# Patient Record
Sex: Female | Born: 1980 | Race: White | Hispanic: No | Marital: Single | Smoking: Never smoker
Health system: Southern US, Community
[De-identification: ages and names within clinical notes are randomized; demographics above are authoritative.]

## PROBLEM LIST (undated history)

## (undated) DIAGNOSIS — M1990 Unspecified osteoarthritis, unspecified site: Secondary | ICD-10-CM

## (undated) DIAGNOSIS — F988 Other specified behavioral and emotional disorders with onset usually occurring in childhood and adolescence: Secondary | ICD-10-CM

## (undated) DIAGNOSIS — F419 Anxiety disorder, unspecified: Secondary | ICD-10-CM

## (undated) DIAGNOSIS — R12 Heartburn: Secondary | ICD-10-CM

## (undated) DIAGNOSIS — J45909 Unspecified asthma, uncomplicated: Secondary | ICD-10-CM

## (undated) DIAGNOSIS — F32A Depression, unspecified: Secondary | ICD-10-CM

## (undated) HISTORY — DX: Heartburn: R12

## (undated) HISTORY — PX: OTHER SURGICAL HISTORY: SHX169

## (undated) HISTORY — PX: SPLENECTOMY, TOTAL: SHX788

## (undated) HISTORY — DX: Other specified behavioral and emotional disorders with onset usually occurring in childhood and adolescence: F98.8

## (undated) HISTORY — DX: Unspecified osteoarthritis, unspecified site: M19.90

## (undated) HISTORY — DX: Depression, unspecified: F32.A

## (undated) HISTORY — DX: Unspecified asthma, uncomplicated: J45.909

## (undated) HISTORY — DX: Anxiety disorder, unspecified: F41.9

---
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
## 2005-01-08 HISTORY — PX: PANCREAS SURGERY: SHX731

## 2005-01-08 HISTORY — PX: SPLENECTOMY, TOTAL: SHX788

## 2014-04-29 HISTORY — PX: TONSILLECTOMY: SUR1361

## 2019-08-13 ENCOUNTER — Ambulatory Visit: Payer: Managed Care, Other (non HMO)

## 2019-08-13 ENCOUNTER — Ambulatory Visit (INDEPENDENT_AMBULATORY_CARE_PROVIDER_SITE_OTHER): Payer: Managed Care, Other (non HMO)

## 2019-08-13 ENCOUNTER — Ambulatory Visit
Admission: EM | Admit: 2019-08-13 | Discharge: 2019-08-13 | Disposition: A | Discharge status: Home / Self Care | Payer: Managed Care, Other (non HMO)

## 2019-08-13 ENCOUNTER — Other Ambulatory Visit: Payer: Self-pay

## 2019-08-13 DIAGNOSIS — M25572 Pain in left ankle and joints of left foot: Secondary | ICD-10-CM

## 2019-08-13 DIAGNOSIS — S92352A Displaced fracture of fifth metatarsal bone, left foot, initial encounter for closed fracture: Secondary | ICD-10-CM | POA: Diagnosis not present

## 2019-08-13 DIAGNOSIS — W19XXXA Unspecified fall, initial encounter: Secondary | ICD-10-CM

## 2019-08-13 DIAGNOSIS — M79672 Pain in left foot: Secondary | ICD-10-CM

## 2019-08-13 NOTE — Discharge Instructions (Addendum)

## 2019-08-13 NOTE — ED Triage Notes (Signed)
Patient presents with left ankle injury/pain that occurred last night while "playing around at home."  She has been taking Tylenol with some relief and ice pack applied after injury occurred.

## 2019-08-13 NOTE — ED Provider Notes (Signed)
[facility]    CSN: [id number] Arrival date & time: 08/13/19  1019      History   Chief Complaint Chief Complaint  Patient presents with   Ankle Injury    left    HPI Leslie Holt is a 39 y.o. female with history of splenectomy presenting for left ankle pain/swelling after injury that occurred last night.  Patient states that she was demonstrating a flying kick when she landed on her left ankle and suffered subsequent inversion injury.  Patient has been using Tylenol, ice with moderate relief of pain since injury.  States she has difficulty moving her fifth toe: Denies numbness.  Unable to bear weight.   History reviewed. No pertinent past medical history.  There are no problems to display for this patient.   Past Surgical History:  Procedure Laterality Date   pancreas     SPLENECTOMY, TOTAL      OB History   No obstetric history on file.      Home Medications    Prior to Admission medications   Medication Sig Start Date End Date Taking? Authorizing Provider  buPROPion (WELLBUTRIN XL) 300 MG 24 hr tablet Take 300 mg by mouth daily.   Yes [provider]  escitalopram (LEXAPRO) 10 MG tablet Take 10 mg by mouth daily.   Yes [provider]  Norgestimate-Ethinyl Estradiol Triphasic (ORTHO TRI-CYCLEN LO) 0.18/0.215/0.25 MG-25 MCG tab Take 1 tablet by mouth daily.   Yes [provider]    Family History History reviewed. No pertinent family history.  Social History Social History   Tobacco Use   Smoking status: Never Smoker   Smokeless tobacco: Never Used  Substance Use Topics   Alcohol use: Yes    Comment: occassional   Drug use: Never     Allergies   Latex, Shellfish allergy, and Sulfa antibiotics   Review of Systems As per HPI   Physical Exam Triage Vital Signs ED Triage Vitals  Enc Vitals Group     BP 08/13/19 1036 111/60     Pulse Rate 08/13/19 1042 (!) 105     Resp 08/13/19 1034 15     Temp  08/13/19 1034 98.8 F (37.1 C)     Temp Source 08/13/19 1034 Oral     SpO2 08/13/19 1034 96 %     Weight --      Height --      Head Circumference --      Peak Flow --      Pain Score 08/13/19 1038 5     Pain Loc --      Pain Edu? --      Excl. in [place]? --    No data found.  Updated Vital Signs BP 111/60 (BP Location: Right Arm)    Pulse (!) 105    Temp 98.8 F (37.1 C) (Oral)    Resp 15    LMP 08/05/2019 (Exact Date)    SpO2 95%   Visual Acuity Right Eye Distance:   Left Eye Distance:   Bilateral Distance:    Right Eye Near:   Left Eye Near:    Bilateral Near:     Physical Exam Constitutional:      General: She is not in acute distress. HENT:     Head: Normocephalic and atraumatic.     Mouth/Throat:     Mouth: Mucous membranes are moist.     Pharynx: Oropharynx is clear.  Eyes:     General: No scleral icterus.  Pupils: Pupils are equal, round, and reactive to light.  Neck:     Comments: Trachea midline, negative JVD Cardiovascular:     Rate and Rhythm: Tachycardia present.  Pulmonary:     Effort: Pulmonary effort is normal.  Musculoskeletal:     Comments: Patient has significant swelling over dorsal lateral aspect of foot with blue discoloration as well as significant swelling over the lateral malleoli with significant TTP.  Patient has decreased ROM of ankle, toes.  DP pulses 2+ bilaterally, skin is warm, and patient endorsing intact sensation in feet bilaterally.  Skin:    Capillary Refill: Capillary refill takes less than 2 seconds.     Coloration: Skin is not jaundiced or pale.     Findings: Bruising present.     Comments: No open wound  Neurological:     General: No focal deficit present.     Mental Status: She is alert and oriented to person, place, and time.      UC Treatments / Results  Labs (all labs ordered are listed, but only abnormal results are displayed) Labs Reviewed - No data to display  EKG   Radiology DG Ankle Complete  Left  Result Date: 08/13/2019 CLINICAL DATA:  Status post injury with swelling and pain. EXAM: LEFT ANKLE COMPLETE - 3+ VIEW COMPARISON:  None. FINDINGS: There is fracture of the fifth metatarsal shaft. Soft tissue swelling over the lateral malleolus is noted. No other acute fracture or dislocation is noted. IMPRESSION: Fracture of fifth metatarsal shaft. Soft tissue swelling over lateral malleolus. Electronically Signed   By: Sherian Rein M.D.   On: 08/13/2019 11:21   DG Foot Complete Left  Result Date: 08/13/2019 CLINICAL DATA:  Left foot pain for 1 day EXAM: LEFT FOOT - COMPLETE 3+ VIEW COMPARISON:  None. FINDINGS: There is displaced fracture of the fifth metatarsal shaft. There is no dislocation. IMPRESSION: Fracture of fifth metatarsal shaft. Electronically Signed   By: Sherian Rein M.D.   On: 08/13/2019 11:20    Procedures Procedures (including critical care time)  Medications Ordered in UC Medications - No data to display  Initial Impression / Assessment and Plan / UC Course  I have reviewed the triage vital signs and the nursing notes.  Pertinent labs & imaging results that were available during my care of the patient were reviewed by me and considered in my medical decision making (see chart for details).     Patient afebrile, nontoxic in office today.  Mild tachycardia likely secondary to pain : Denies active chest pain or difficulty breathing.  X-ray of left ankle and foot obtained in office, reviewed by me radiology: Significant for soft tissue swelling over lateral malleolus and noted to have displaced fracture of fifth metatarsal shaft without dislocation.  Low concern for compartment syndrome at this time, though discussed return precautions thereof.  Discussed case with Dr. Everardo Pacific of orthopedics who agrees with A/P: Patient given crutches, ice, cam walker boot with instructions to be nonweightbearing and follow-up with him in office at 8:30 tomorrow morning.  Relayed  appointment date/time with patient who verbalized understanding, agreeable to keeping this.  Tolerated placement of cam walker boot well and ambulated with crutches to my satisfaction.  Return precautions discussed, patient verbalized understanding and is agreeable to plan.  Greater than 45 minutes were spent providing and coordinating care and reviewing diagnostics done in office. Final Clinical Impressions(s) / UC Diagnoses   Final diagnoses:  Closed displaced fracture of fifth metatarsal bone of left foot,  initial encounter     Discharge Instructions     Recommend RICE: rest, ice, compression, elevation as needed for pain.    Heat therapy (hot compress, warm wash red, hot showers, etc.) can help relax muscles and soothe muscle aches. Cold therapy (ice packs) can be used to help swelling both after injury and after prolonged use of areas of chronic pain/aches.  For pain: recommend 350 mg-1000 mg of Tylenol (acetaminophen) and/or 200 mg - 800 mg of Advil (ibuprofen, Motrin) every 8 hours as needed.  May alternate between the two throughout the day as they are generally safe to take together.  DO NOT exceed more than 3000 mg of Tylenol or 3200 mg of ibuprofen in a 24 hour period as this could damage your stomach, kidneys, liver, or increase your bleeding risk.     ED Prescriptions    None     I have reviewed the PDMP during this encounter.   Hall[place]Potvin, [place], [place] 08/13/19 1227

## 2020-04-08 ENCOUNTER — Ambulatory Visit
Admission: RE | Admit: 2020-04-08 | Discharge: 2020-04-08 | Disposition: A | Discharge status: Home / Self Care | Payer: Managed Care, Other (non HMO) | Source: Ambulatory Visit | Attending: Emergency Medicine | Admitting: Emergency Medicine

## 2020-04-08 ENCOUNTER — Other Ambulatory Visit: Payer: Self-pay

## 2020-04-08 VITALS — BP 128/79 | HR 110 | Temp 98.6°F | Resp 18

## 2020-04-08 DIAGNOSIS — J0100 Acute maxillary sinusitis, unspecified: Secondary | ICD-10-CM

## 2020-04-08 DIAGNOSIS — Z1152 Encounter for screening for COVID-19: Secondary | ICD-10-CM

## 2020-04-08 DIAGNOSIS — Z20822 Contact with and (suspected) exposure to covid-19: Secondary | ICD-10-CM | POA: Diagnosis not present

## 2020-04-08 MED ORDER — BENZONATATE 200 MG PO CAPS: 200.0000 mg | ORAL_CAPSULE | Freq: Three times a day (TID) | ORAL | 0 refills | Status: AC | PRN

## 2020-04-08 MED ORDER — OLOPATADINE HCL 0.1 % OP SOLN: 1.0000 [drp] | Freq: Two times a day (BID) | OPHTHALMIC | 0 refills | Status: DC

## 2020-04-08 MED ORDER — FLUTICASONE PROPIONATE 50 MCG/ACT NA SUSP: 1.0000 | Freq: Every day | NASAL | 0 refills | Status: DC

## 2020-04-08 MED ORDER — AMOXICILLIN-POT CLAVULANATE 875-125 MG PO TABS: 1.0000 | ORAL_TABLET | Freq: Two times a day (BID) | ORAL | 0 refills | Status: AC

## 2020-04-08 NOTE — ED Triage Notes (Signed)
Pt c/o sinus pressure to lt side of face with lt eye watery, lt ear pressure, sore throat, and cough since Saturday evening.

## 2020-04-08 NOTE — ED Provider Notes (Signed)
[facility]    CSN: [id number] Arrival date & time: 04/08/20  1455      History   Chief Complaint Chief Complaint  Patient presents with   appt 3- sinus pressure    HPI Leslie Holt is a 39 y.o. female presenting today for evaluation of sinus congestion pressure and eye watering.  Patient reports symptoms began on Thursday, approximately 5 to 6 days ago.  She denies any associated fevers.  She takes Claritin daily, but has not used other over-the-counter medicines for symptoms.  Does report sore throat and cough, but attributes this to drainage.  Denies close sick contacts.   HPI  History reviewed. No pertinent past medical history.  There are no problems to display for this patient.   Past Surgical History:  Procedure Laterality Date   pancreas     SPLENECTOMY, TOTAL      OB History   No obstetric history on file.      Home Medications    Prior to Admission medications   Medication Sig Start Date End Date Taking? Authorizing Provider  amoxicillin-clavulanate (AUGMENTIN) 875-125 MG tablet Take 1 tablet by mouth every 12 (twelve) hours for 7 days. 04/08/20 04/15/20  Jedaiah Rathbun C, PA-C  benzonatate (TESSALON) 200 MG capsule Take 1 capsule (200 mg total) by mouth 3 (three) times daily as needed for up to 7 days for cough. 04/08/20 04/15/20  Cailyn Houdek C, PA-C  buPROPion (WELLBUTRIN XL) 300 MG 24 hr tablet Take 300 mg by mouth daily.    [provider]  escitalopram (LEXAPRO) 10 MG tablet Take 10 mg by mouth daily.    [provider]  fluticasone (FLONASE) 50 MCG/ACT nasal spray Place 1-2 sprays into both nostrils daily. 04/08/20   Elody Kleinsasser C, PA-C  Norgestimate-Ethinyl Estradiol Triphasic (ORTHO TRI-CYCLEN LO) 0.18/0.215/0.25 MG-25 MCG tab Take 1 tablet by mouth daily.    [provider]  olopatadine (PATANOL) 0.1 % ophthalmic solution Place 1 drop into both eyes 2 (two) times daily. 04/08/20   Tiannah Greenly, Junius Creamer, PA-C    Family History No family history on file.  Social History Social History   Tobacco Use   Smoking status: Never Smoker   Smokeless tobacco: Never Used  Substance Use Topics   Alcohol use: Yes    Comment: occassional   Drug use: Never     Allergies   Latex, Shellfish allergy, and Sulfa antibiotics   Review of Systems Review of Systems  Constitutional: Positive for fatigue. Negative for activity change, appetite change, chills and fever.  HENT: Positive for congestion, rhinorrhea, sinus pressure and sore throat. Negative for ear pain and trouble swallowing.   Eyes: Negative for discharge and redness.  Respiratory: Positive for cough. Negative for chest tightness and shortness of breath.   Cardiovascular: Negative for chest pain.  Gastrointestinal: Negative for abdominal pain, diarrhea, nausea and vomiting.  Musculoskeletal: Negative for myalgias.  Skin: Negative for rash.  Neurological: Negative for dizziness, light-headedness and headaches.     Physical Exam Triage Vital Signs ED Triage Vitals  Enc Vitals Group     BP 04/08/20 1528 128/79     Pulse Rate 04/08/20 1528 (!) 110     Resp 04/08/20 1528 18     Temp 04/08/20 1528 98.6 F (37 C)     Temp Source 04/08/20 1528 Oral     SpO2 04/08/20 1528 94 %     Weight --      Height --  Head Circumference --      Peak Flow --      Pain Score 04/08/20 1529 2     Pain Loc --      Pain Edu? --      Excl. in [place]? --    No data found.  Updated Vital Signs BP 128/79 (BP Location: Left Arm)    Pulse (!) 110    Temp 98.6 F (37 C) (Oral)    Resp 18    LMP 03/25/2020    SpO2 94%   Visual Acuity Right Eye Distance:   Left Eye Distance:   Bilateral Distance:    Right Eye Near:   Left Eye Near:    Bilateral Near:     Physical Exam Vitals and nursing note reviewed.  Constitutional:      Appearance: She is well-developed.     Comments: No acute distress  HENT:     Head: Normocephalic and  atraumatic.     Ears:     Comments: Bilateral ears without tenderness to palpation of external auricle, tragus and mastoid, EAC's without erythema or swelling, TM's with good bony landmarks and cone of light. Non erythematous.     Nose: Nose normal.     Mouth/Throat:     Comments: Oral mucosa pink and moist, no tonsillar enlargement or exudate. Posterior pharynx patent and nonerythematous, no uvula deviation or swelling. Normal phonation. Eyes:     Extraocular Movements: Extraocular movements intact.     Conjunctiva/sclera: Conjunctivae normal.     Pupils: Pupils are equal, round, and reactive to light.     Comments: Left eye with clear watery drainage, no erythema  Cardiovascular:     Rate and Rhythm: Normal rate.  Pulmonary:     Effort: Pulmonary effort is normal. No respiratory distress.     Comments: Breathing comfortably at rest, CTABL, no wheezing, rales or other adventitious sounds auscultated Abdominal:     General: There is no distension.  Musculoskeletal:        General: Normal range of motion.     Cervical back: Neck supple.  Skin:    General: Skin is warm and dry.  Neurological:     Mental Status: She is alert and oriented to person, place, and time.      UC Treatments / Results  Labs (all labs ordered are listed, but only abnormal results are displayed) Labs Reviewed  NOVEL CORONAVIRUS, NAA    EKG   Radiology No results found.  Procedures Procedures (including critical care time)  Medications Ordered in UC Medications - No data to display  Initial Impression / Assessment and Plan / UC Course  I have reviewed the triage vital signs and the nursing notes.  Pertinent labs & imaging results that were available during my care of the patient were reviewed by me and considered in my medical decision making (see chart for details).     Covid test pending, treating for sinusitis given unilateral nature with associated pressure, Augmentin x7 days, continue  symptomatic and supportive care as well Flonase to add to Claritin, Tessalon for cough.  Rest and fluids.  Discussed strict return precautions. Patient verbalized understanding and is agreeable with plan.  Final Clinical Impressions(s) / UC Diagnoses   Final diagnoses:  Encounter for screening laboratory testing for COVID-19 virus  Acute non-recurrent maxillary sinusitis     Discharge Instructions     Covid test pending, monitor my chart for results Begin Augmentin twice daily for 1 week Continue Claritin,  add in Flonase nasal spray 1 to 2 spray in each nostril daily Tessalon/benzonatate every 8 hours for cough Olopatadine eyedrops in left eye as needed for tearing twice daily Rest and fluids Follow-up if not improving or worsening    ED Prescriptions    Medication Sig Dispense Auth. Provider   olopatadine (PATANOL) 0.1 % ophthalmic solution Place 1 drop into both eyes 2 (two) times daily. 5 mL Jobin Montelongo C, PA-C   amoxicillin-clavulanate (AUGMENTIN) 875-125 MG tablet Take 1 tablet by mouth every 12 (twelve) hours for 7 days. 14 tablet Forestine Macho C, PA-C   fluticasone (FLONASE) 50 MCG/ACT nasal spray Place 1-2 sprays into both nostrils daily. 16 g Amogh Komatsu C, PA-C   benzonatate (TESSALON) 200 MG capsule Take 1 capsule (200 mg total) by mouth 3 (three) times daily as needed for up to 7 days for cough. 28 capsule Kioni Stahl, [place] C, PA-C     PDMP not reviewed this encounter.   Lew Dawes, [place] 04/08/20 1619

## 2020-04-08 NOTE — Discharge Instructions (Signed)
Covid test pending, monitor my chart for results Begin Augmentin twice daily for 1 week Continue Claritin, add in Flonase nasal spray 1 to 2 spray in each nostril daily Tessalon/benzonatate every 8 hours for cough Olopatadine eyedrops in left eye as needed for tearing twice daily Rest and fluids Follow-up if not improving or worsening

## 2020-04-09 LAB — SARS-COV-2, NAA 2 DAY TAT

## 2020-04-09 LAB — NOVEL CORONAVIRUS, NAA: SARS-CoV-2, NAA: NOT DETECTED

## 2020-06-15 ENCOUNTER — Ambulatory Visit: Payer: Managed Care, Other (non HMO) | Attending: Internal Medicine

## 2020-06-15 DIAGNOSIS — Z23 Encounter for immunization: Secondary | ICD-10-CM

## 2020-06-15 NOTE — Progress Notes (Signed)
   Covid-19 Vaccination Clinic  Name:  Leslie Holt    MRN: [id number] DOB: 1980/07/29  06/15/2020  [name] was observed post Covid-19 immunization for 15 minutes without incident. She was provided with Vaccine Information Sheet and instruction to access the V-Safe system.   [name] was instructed to call 911 with any severe reactions post vaccine: Marland Kitchen Difficulty breathing  . Swelling of face and throat  . A fast heartbeat  . A bad rash all over body  . Dizziness and weakness   Immunizations Administered    Name Date Dose VIS Date Route   Pfizer COVID-19 Vaccine 06/15/2020 12:02 PM 0.3 mL 04/17/2020 Intramuscular   Manufacturer: [employer], [employer]   Lot: [id number]   NDC: [address]-7

## 2020-10-08 ENCOUNTER — Ambulatory Visit
Admission: RE | Admit: 2020-10-08 | Discharge: 2020-10-08 | Disposition: A | Discharge status: Home / Self Care | Payer: Managed Care, Other (non HMO) | Source: Ambulatory Visit | Attending: Emergency Medicine | Admitting: Emergency Medicine

## 2020-10-08 ENCOUNTER — Other Ambulatory Visit: Payer: Self-pay

## 2020-10-08 VITALS — BP 115/77 | HR 93 | Temp 98.8°F | Resp 18

## 2020-10-08 DIAGNOSIS — J0100 Acute maxillary sinusitis, unspecified: Secondary | ICD-10-CM

## 2020-10-08 MED ORDER — AMOXICILLIN-POT CLAVULANATE 875-125 MG PO TABS: 1.0000 | ORAL_TABLET | Freq: Two times a day (BID) | ORAL | 0 refills | Status: AC

## 2020-10-08 MED ORDER — PREDNISONE 20 MG PO TABS: 40.0000 mg | ORAL_TABLET | Freq: Every day | ORAL | 0 refills | Status: AC

## 2020-10-08 NOTE — Discharge Instructions (Signed)
Begin Augmentin twice daily for 1 week Prednisone 40 mg daily for 5 days Continue Allegra, Flonase every 2 to 3 days Rest and fluids Follow-up if not improving or worsening

## 2020-10-08 NOTE — ED Triage Notes (Signed)
Pt c/o rt side sinus pressure and drainage since Sunday. States doing nasal rinses with no relief.

## 2020-10-08 NOTE — ED Provider Notes (Signed)
[facility]    CSN: [id number] Arrival date & time: 10/08/20  1148      History   Chief Complaint Chief Complaint  Patient presents with  . Facial Pain  . appt 12    HPI Leslie Holt is a 40 y.o. female presenting today for evaluation of sinus pressure.  Reports right-sided sinus pressure and pain with associated congestion x3 to 4 days, using sinus rinses, Allegra and Flonase without relief.  History of recurrent sinus infections.  Denies any known fevers.  Denies significant cough chest pain or shortness of breath.  HPI  History reviewed. No pertinent past medical history.  There are no problems to display for this patient.   Past Surgical History:  Procedure Laterality Date  . pancreas    . SPLENECTOMY, TOTAL      OB History   No obstetric history on file.      Home Medications    Prior to Admission medications   Medication Sig Start Date End Date Taking? Authorizing Provider  amoxicillin-clavulanate (AUGMENTIN) 875-125 MG tablet Take 1 tablet by mouth every 12 (twelve) hours for 7 days. 10/08/20 10/15/20 Yes Paeton Studer C, PA-C  predniSONE (DELTASONE) 20 MG tablet Take 2 tablets (40 mg total) by mouth daily for 5 days. 10/08/20 10/13/20 Yes Tocarra Gassen C, PA-C  buPROPion (WELLBUTRIN XL) 300 MG 24 hr tablet Take 300 mg by mouth daily.    [provider]  escitalopram (LEXAPRO) 10 MG tablet Take 10 mg by mouth daily.    [provider]  Norgestimate-Ethinyl Estradiol Triphasic (ORTHO TRI-CYCLEN LO) 0.18/0.215/0.25 MG-25 MCG tab Take 1 tablet by mouth daily.    [provider]    Family History History reviewed. No pertinent family history.  Social History Social History   Tobacco Use  . Smoking status: Never Smoker  . Smokeless tobacco: Never Used  Substance Use Topics  . Alcohol use: Yes    Comment: occassional  . Drug use: Never     Allergies   Latex, Shellfish allergy, and Sulfa antibiotics   Review  of Systems Review of Systems  Constitutional: Negative for activity change, appetite change, chills, fatigue and fever.  HENT: Positive for congestion, rhinorrhea, sinus pressure and sore throat. Negative for ear pain and trouble swallowing.   Eyes: Negative for discharge and redness.  Respiratory: Negative for cough, chest tightness and shortness of breath.   Cardiovascular: Negative for chest pain.  Gastrointestinal: Negative for abdominal pain, diarrhea, nausea and vomiting.  Musculoskeletal: Negative for myalgias.  Skin: Negative for rash.  Neurological: Negative for dizziness, light-headedness and headaches.     Physical Exam Triage Vital Signs ED Triage Vitals  Enc Vitals Group     BP      Pulse      Resp      Temp      Temp src      SpO2      Weight      Height      Head Circumference      Peak Flow      Pain Score      Pain Loc      Pain Edu?      Excl. in [place]?    No data found.  Updated Vital Signs BP 115/77 (BP Location: Left Arm)   Pulse 93   Temp 98.8 F (37.1 C) (Oral)   Resp 18   LMP 09/17/2020   SpO2 97%   Visual Acuity Right Eye Distance:  Left Eye Distance:   Bilateral Distance:    Right Eye Near:   Left Eye Near:    Bilateral Near:     Physical Exam Vitals and nursing note reviewed.  Constitutional:      Appearance: She is well-developed.     Comments: No acute distress  HENT:     Head: Normocephalic and atraumatic.     Ears:     Comments: Bilateral ears without tenderness to palpation of external auricle, tragus and mastoid, EAC's without erythema or swelling, TM's with good bony landmarks and cone of light. Non erythematous.     Nose: Nose normal.     Mouth/Throat:     Comments: Oral mucosa pink and moist, no tonsillar enlargement or exudate. Posterior pharynx patent and nonerythematous, no uvula deviation or swelling. Normal phonation. Eyes:     Conjunctiva/sclera: Conjunctivae normal.  Cardiovascular:     Rate and Rhythm:  Normal rate and regular rhythm.  Pulmonary:     Effort: Pulmonary effort is normal. No respiratory distress.     Comments: Breathing comfortably at rest, CTABL, no wheezing, rales or other adventitious sounds auscultated Abdominal:     General: There is no distension.  Musculoskeletal:        General: Normal range of motion.     Cervical back: Neck supple.  Skin:    General: Skin is warm and dry.  Neurological:     Mental Status: She is alert and oriented to person, place, and time.      UC Treatments / Results  Labs (all labs ordered are listed, but only abnormal results are displayed) Labs Reviewed - No data to display  EKG   Radiology No results found.  Procedures Procedures (including critical care time)  Medications Ordered in UC Medications - No data to display  Initial Impression / Assessment and Plan / UC Course  I have reviewed the triage vital signs and the nursing notes.  Pertinent labs & imaging results that were available during my care of the patient were reviewed by me and considered in my medical decision making (see chart for details).     Treating for sinusitis given unilateral symptoms although his symptoms only for 3 to 4 days, providing Augmentin, prednisone course x5 days and continuing over-the-counter allergy/congestion medicines.  Push fluids.  Discussed strict return precautions. Patient verbalized understanding and is agreeable with plan.  Final Clinical Impressions(s) / UC Diagnoses   Final diagnoses:  Acute non-recurrent maxillary sinusitis     Discharge Instructions     Begin Augmentin twice daily for 1 week Prednisone 40 mg daily for 5 days Continue Allegra, Flonase every 2 to 3 days Rest and fluids Follow-up if not improving or worsening    ED Prescriptions    Medication Sig Dispense Auth. Provider   amoxicillin-clavulanate (AUGMENTIN) 875-125 MG tablet Take 1 tablet by mouth every 12 (twelve) hours for 7 days. 14 tablet  Anamarie Hunn C, PA-C   predniSONE (DELTASONE) 20 MG tablet Take 2 tablets (40 mg total) by mouth daily for 5 days. 10 tablet Dollie Bressi, [place] C, PA-C     PDMP not reviewed this encounter.   Lew Dawes, [place] 10/08/20 1347

## 2020-11-16 IMAGING — DX DG FOOT COMPLETE 3+V*L*
3 series · 3 of 3 positions shown · non-contrast
Comparison: None.

CLINICAL DATA: Left foot pain for 1 day

EXAM:
LEFT FOOT - COMPLETE 3+ VIEW

[foot supine dp]
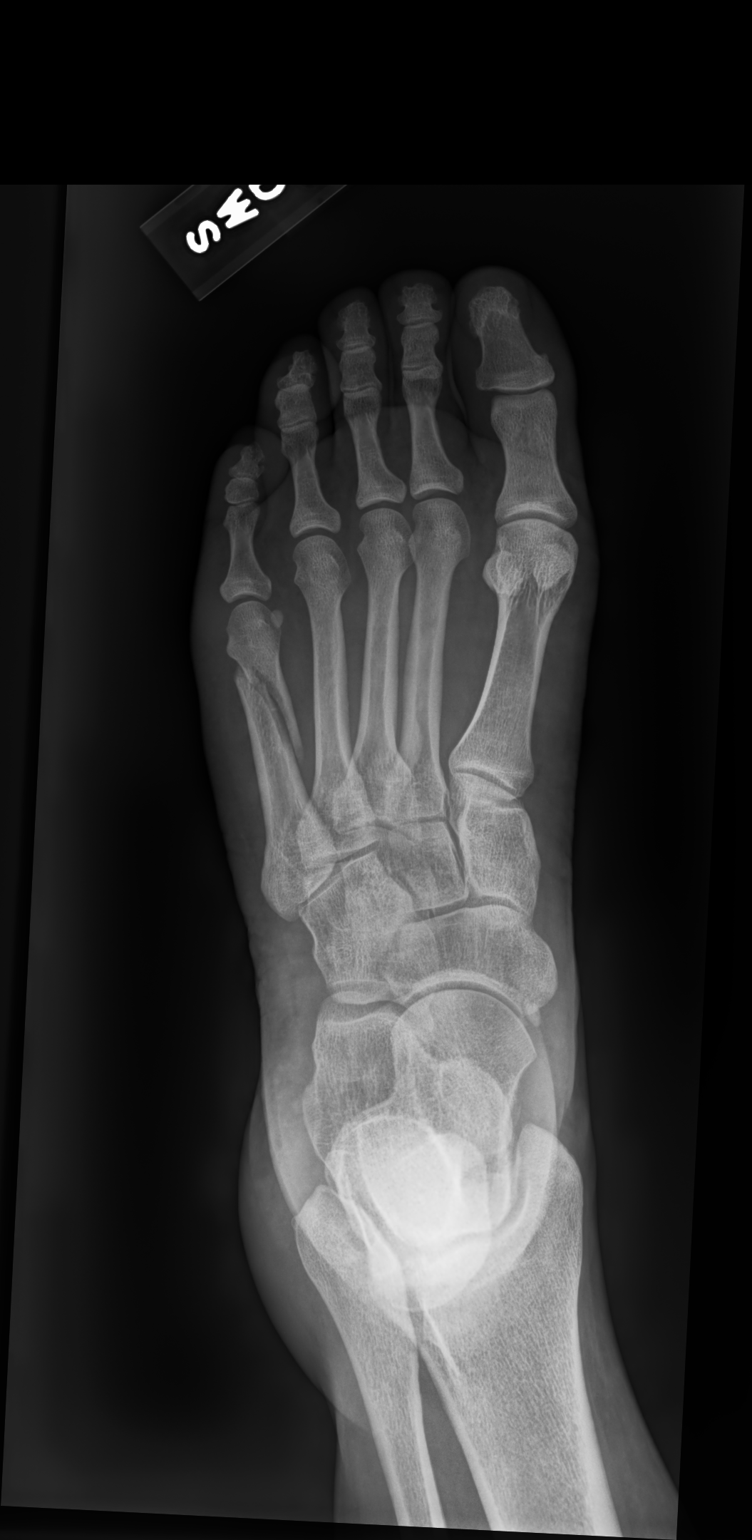

[foot medial oblique]
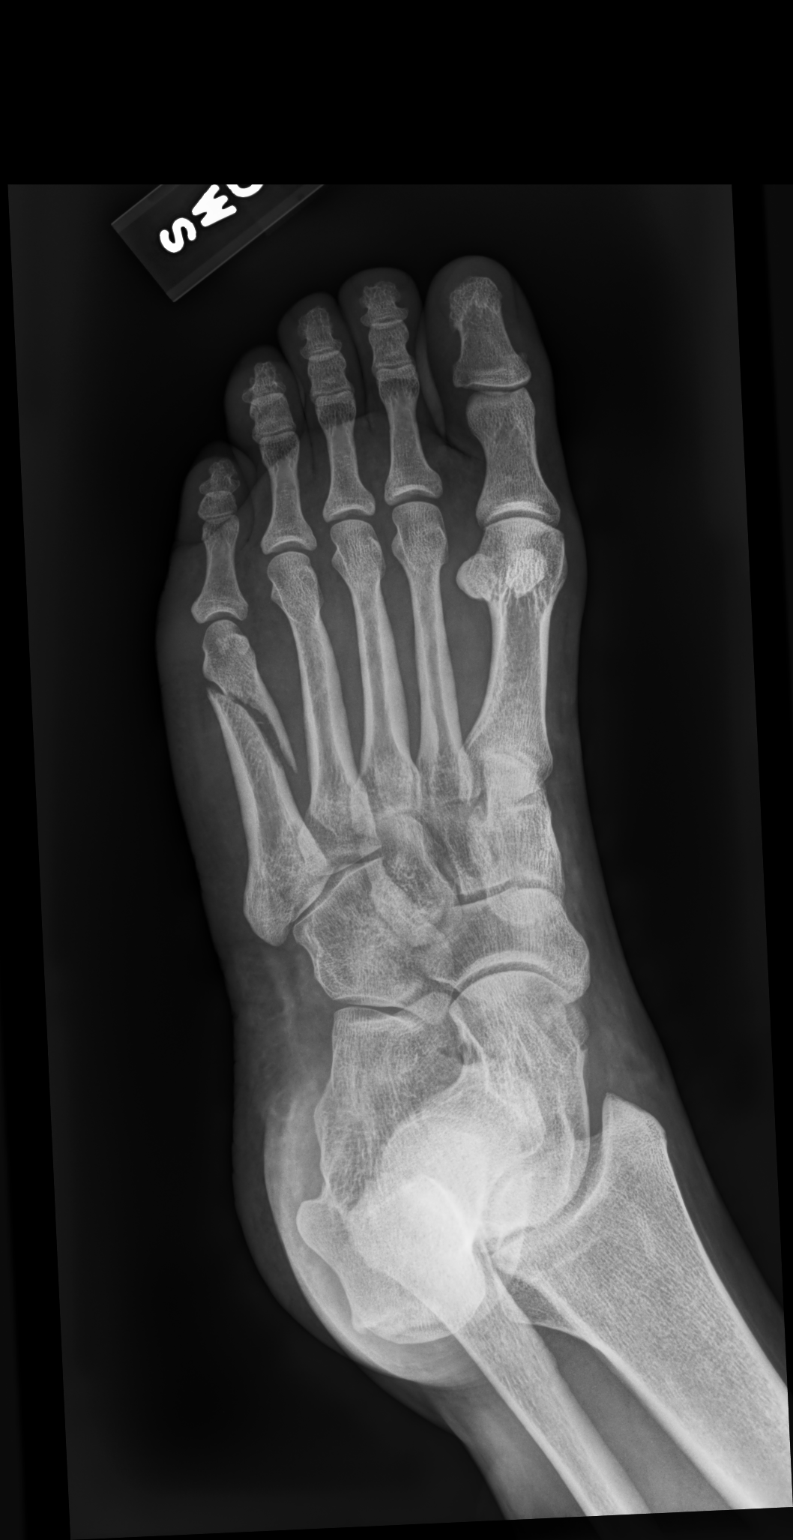

[foot supine lat]
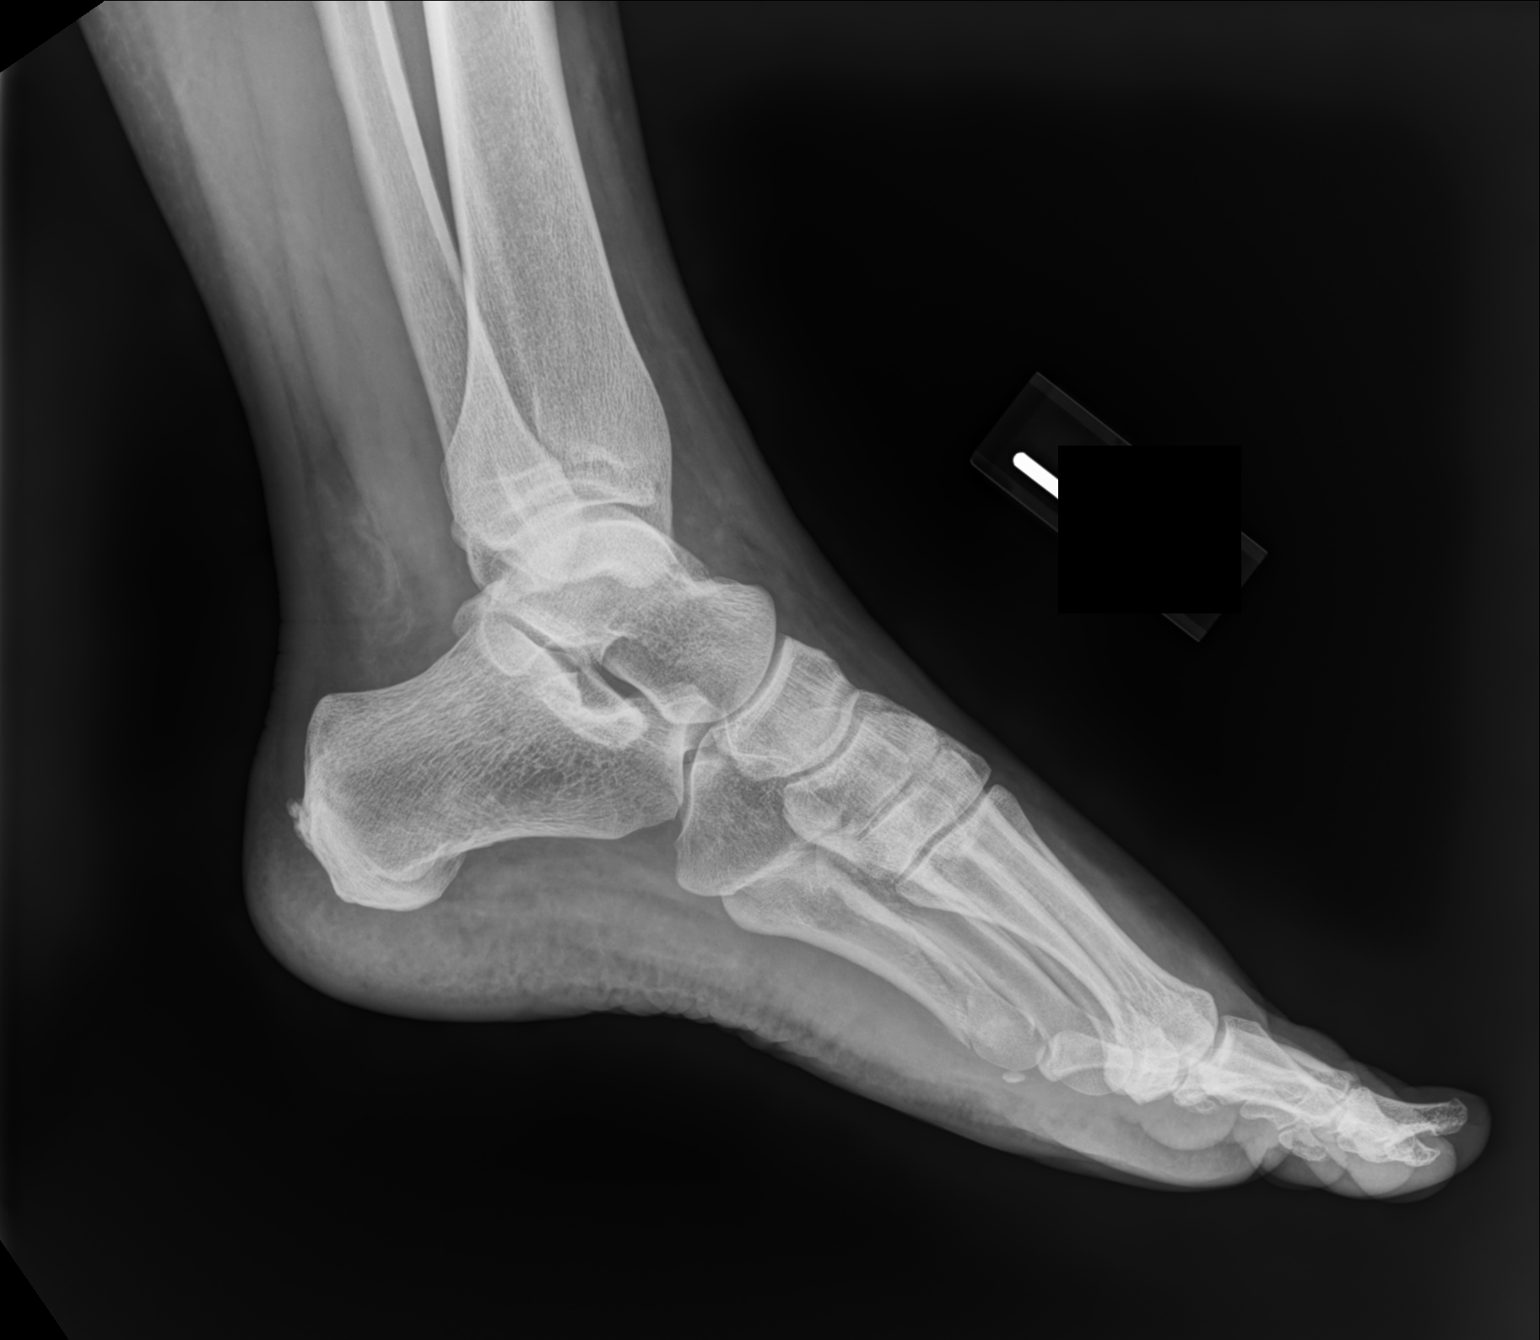

[3 of 3 positions shown; findings below may reference images not displayed]

FINDINGS: There is displaced fracture of the fifth metatarsal shaft. There is
no dislocation.
IMPRESSION: Fracture of fifth metatarsal shaft.

## 2021-01-16 ENCOUNTER — Ambulatory Visit
Admission: EM | Admit: 2021-01-16 | Discharge: 2021-01-16 | Disposition: A | Discharge status: Home / Self Care | Payer: Managed Care, Other (non HMO) | Attending: Physician Assistant | Admitting: Physician Assistant

## 2021-01-16 ENCOUNTER — Other Ambulatory Visit: Payer: Self-pay

## 2021-01-16 DIAGNOSIS — L03115 Cellulitis of right lower limb: Secondary | ICD-10-CM

## 2021-01-16 DIAGNOSIS — W57XXXA Bitten or stung by nonvenomous insect and other nonvenomous arthropods, initial encounter: Secondary | ICD-10-CM | POA: Diagnosis not present

## 2021-01-16 MED ORDER — PREDNISONE 20 MG PO TABS: 40.0000 mg | ORAL_TABLET | Freq: Every day | ORAL | 0 refills | Status: DC

## 2021-01-16 MED ORDER — CEPHALEXIN 500 MG PO CAPS: 500.0000 mg | ORAL_CAPSULE | Freq: Three times a day (TID) | ORAL | 0 refills | Status: AC

## 2021-01-16 NOTE — ED Provider Notes (Signed)
[facility]    CSN: [id number] Arrival date & time: 01/16/21  2979      History   Chief Complaint Chief Complaint  Patient presents with   Insect Bite    HPI Leslie Holt is a 40 y.o. female.   Patient presents today with a 5-day history of spreading erythema on her left ankle.  Reports that she was walking in the woods when a flying insect stung her.  She saw the insect but is not exactly sure what type it was.  Since that time she has had spreading erythema with associated pruritus and pain.  Discomfort is rated 3 on a 0-10 pain scale, described as aching, no aggravating or alleviating factors identified.  She has tried topical cortisone cream as well as Benadryl without improvement of symptoms.  Denies any nausea, vomiting, shortness of breath, muffled voice, throat swelling.  She is eating and drinking normally.  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  She denies history of severe allergies such as anaphylaxis.   History reviewed. No pertinent past medical history.  There are no problems to display for this patient.   Past Surgical History:  Procedure Laterality Date   pancreas     SPLENECTOMY, TOTAL      OB History   No obstetric history on file.      Home Medications    Prior to Admission medications   Medication Sig Start Date End Date Taking? Authorizing Provider  cephALEXin (KEFLEX) 500 MG capsule Take 1 capsule (500 mg total) by mouth 3 (three) times daily for 7 days. 01/16/21 01/23/21 Yes Marc Leichter, Denny Peon K, PA-C  predniSONE (DELTASONE) 20 MG tablet Take 2 tablets (40 mg total) by mouth daily. 01/16/21  Yes Kestrel Mis K, PA-C  buPROPion (WELLBUTRIN XL) 300 MG 24 hr tablet Take 300 mg by mouth daily.    [provider]  escitalopram (LEXAPRO)  10 MG tablet Take 10 mg by mouth daily.    [provider]  Norgestimate-Ethinyl Estradiol Triphasic (ORTHO TRI-CYCLEN LO) 0.18/0.215/0.25 MG-25 MCG tab Take 1 tablet by mouth daily.    [provider]    Family History History reviewed. No pertinent family history.  Social History Social History   Tobacco Use   Smoking status: Never   Smokeless tobacco: Never  Substance Use Topics   Alcohol use: Yes    Comment: occassional   Drug use: Never     Allergies   Latex, Shellfish allergy, and Sulfa antibiotics   Review of Systems Review of Systems  Constitutional:  Negative for activity change, appetite change, fatigue and fever.  Respiratory:  Negative for cough and shortness of breath.   Cardiovascular:  Negative for chest pain.  Gastrointestinal:  Negative for abdominal pain, diarrhea, nausea and vomiting.  Skin:  Positive for color change, rash and wound.  Neurological:  Negative for dizziness, weakness, light-headedness, numbness and headaches.    Physical Exam Triage Vital Signs ED Triage Vitals  Enc Vitals Group     BP 01/16/21 0825 107/69     Pulse Rate 01/16/21 0825 89     Resp 01/16/21 0825 16     Temp 01/16/21 0825 97.8 F (36.6 C)     Temp Source 01/16/21 0825 Oral     SpO2 01/16/21 0825 95 %  Weight --      Height --      Head Circumference --      Peak Flow --      Pain Score 01/16/21 0823 3     Pain Loc --      Pain Edu? --      Excl. in GC? --    No data found.  Updated Vital Signs BP 107/69 (BP Location: Left Arm)   Pulse 89   Temp 97.8 F (36.6 C) (Oral)   Resp 16   SpO2 95%   Visual Acuity Right Eye Distance:   Left Eye Distance:   Bilateral Distance:    Right Eye Near:   Left Eye Near:    Bilateral Near:     Physical Exam Vitals reviewed.  Constitutional:      General: She is awake. She is not in acute distress.    Appearance: Normal appearance. She is normal weight. She is not ill-appearing.      Comments: Very pleasant female appears at age in no acute distress  HENT:     Head: Normocephalic and atraumatic.     Mouth/Throat:     Mouth: Mucous membranes are moist.     Pharynx: Uvula midline. No oropharyngeal exudate or posterior oropharyngeal erythema.     Comments: Normal-appearing oropharynx Cardiovascular:     Rate and Rhythm: Normal rate and regular rhythm.     Heart sounds: Normal heart sounds, S1 normal and S2 normal. No murmur heard. Pulmonary:     Effort: Pulmonary effort is normal.     Breath sounds: Normal breath sounds. No wheezing, rhonchi or rales.     Comments: Clear to auscultation bilaterally Abdominal:     Palpations: Abdomen is soft.     Tenderness: There is no abdominal tenderness.  Skin:    Findings: Erythema and wound present.     Comments: 5 cm x 3 cm erythematous lesion noted anterior left ankle with central ulcerated wound.  Mild serosanguineous drainage coming from wound.  No significant induration or fluctuance.  No streaking or evidence of lymphangitis.  Psychiatric:        Behavior: Behavior is cooperative.     UC Treatments / Results  Labs (all labs ordered are listed, but only abnormal results are displayed) Labs Reviewed - No data to display  EKG   Radiology No results found.  Procedures Procedures (including critical care time)  Medications Ordered in UC Medications - No data to display  Initial Impression / Assessment and Plan / UC Course  I have reviewed the triage vital signs and the nursing notes.  Pertinent labs & imaging results that were available during my care of the patient were reviewed by me and considered in my medical decision making (see chart for details).      Concern for secondary infection given persistent symptoms.  Patient started on Keflex 500 mg 3 times a day for 1 week.  We will prescribe prednisone to help cover for allergic component.  She was instructed not to take NSAIDs with this medication due to  risk of GI bleeding.  Encouraged her to use over-the-counter antihistamines as well as topical medications for additional symptom relief.  Encouraged her to use a cool rag.  Recommended she demarcate area of redness and monitor this.  If this continues to spread despite being on antibiotics for 1 to 2 days she is to return for reevaluation.  Discussed alarm symptoms that warrant emergent evaluation.  Strict return precautions  given to which patient expressed understanding.  Final Clinical Impressions(s) / UC Diagnoses   Final diagnoses:  Infected insect bite or sting  Cellulitis of right lower extremity     Discharge Instructions      Take cephalexin 3 times a day for a week to cover for infection.  Monitor the area of redness and if this continues to spread after being on antibiotics you need to be reevaluated.  Take prednisone 40 mg for 3 days.  While taking this do not take NSAIDs including aspirin, ibuprofen/Advil, naproxen/Aleve as it can cause stomach bleeding.  You can take Tylenol.  Continue with over-the-counter antihistamine such as Benadryl and topical cortisone cream.  If anything worsens including spread of redness, increased pain, fevers, nausea, vomiting you need to be reevaluated.     ED Prescriptions     Medication Sig Dispense Auth. Provider   predniSONE (DELTASONE) 20 MG tablet Take 2 tablets (40 mg total) by mouth daily. 6 tablet Bing Duffey K, PA-C   cephALEXin (KEFLEX) 500 MG capsule Take 1 capsule (500 mg total) by mouth 3 (three) times daily for 7 days. 21 capsule Nylani Michetti K, PA-C      PDMP not reviewed this encounter.   Jeani Hawking, PA-C 01/16/21 [phone]

## 2021-01-16 NOTE — ED Triage Notes (Signed)
Pt present a insect bite on her left leg/foot. Pt states she noticed the area on Sunday. Pt states that the area itches and burn with swelling during the day.

## 2021-01-16 NOTE — Discharge Instructions (Addendum)
Take cephalexin 3 times a day for a week to cover for infection.  Monitor the area of redness and if this continues to spread after being on antibiotics you need to be reevaluated.  Take prednisone 40 mg for 3 days.  While taking this do not take NSAIDs including aspirin, ibuprofen/Advil, naproxen/Aleve as it can cause stomach bleeding.  You can take Tylenol.  Continue with over-the-counter antihistamine such as Benadryl and topical cortisone cream.  If anything worsens including spread of redness, increased pain, fevers, nausea, vomiting you need to be reevaluated.

## 2021-06-26 ENCOUNTER — Other Ambulatory Visit: Payer: Self-pay

## 2021-06-26 ENCOUNTER — Encounter: Payer: Self-pay | Admitting: Emergency Medicine

## 2021-06-26 ENCOUNTER — Ambulatory Visit
Admission: EM | Admit: 2021-06-26 | Discharge: 2021-06-26 | Disposition: A | Discharge status: Home / Self Care | Payer: Managed Care, Other (non HMO) | Attending: Physician Assistant | Admitting: Physician Assistant

## 2021-06-26 DIAGNOSIS — H65191 Other acute nonsuppurative otitis media, right ear: Secondary | ICD-10-CM | POA: Diagnosis not present

## 2021-06-26 DIAGNOSIS — J209 Acute bronchitis, unspecified: Secondary | ICD-10-CM

## 2021-06-26 MED ORDER — PREDNISONE 20 MG PO TABS: 40.0000 mg | ORAL_TABLET | Freq: Every day | ORAL | 0 refills | Status: AC

## 2021-06-26 MED ORDER — AMOXICILLIN-POT CLAVULANATE 875-125 MG PO TABS: 1.0000 | ORAL_TABLET | Freq: Two times a day (BID) | ORAL | 0 refills | Status: DC

## 2021-06-26 NOTE — ED Triage Notes (Signed)
Patient c/o URI, cough, congestion and sore throat x 1 week.  Patient having right ear pain as well.  Patient has been taken Dayquil and Nyquil.

## 2021-06-26 NOTE — ED Provider Notes (Signed)
[facility]    CSN: [id number] Arrival date & time: 06/26/21  0809      History   Chief Complaint Chief Complaint  Patient presents with   Cough    HPI Leslie Holt is a 40 y.o. female.   Patient here today for evaluation of upper respiratory infection, cough, congestion and sore throat that she has had the last week.  She states that her cough seems to be worsening and she has had some shortness of breath develop.  She also reports some right ear pain that seems to be worsening.  She has taken DayQuil and NyQuil with mild relief but no resolution.  The history is provided by the patient.   History reviewed. No pertinent past medical history.  There are no problems to display for this patient.   Past Surgical History:  Procedure Laterality Date   pancreas     SPLENECTOMY, TOTAL      OB History   No obstetric history on file.      Home Medications    Prior to Admission medications   Medication Sig Start Date End Date Taking? Authorizing Provider  amoxicillin-clavulanate (AUGMENTIN) 875-125 MG tablet Take 1 tablet by mouth every 12 (twelve) hours. 06/26/21  Yes Tomi Bamberger, PA-C  buPROPion (WELLBUTRIN XL) 300 MG 24 hr tablet Take 300 mg by mouth daily.   Yes [provider]  escitalopram (LEXAPRO) 10 MG tablet Take 10 mg by mouth daily.   Yes [provider]  Norgestimate-Ethinyl Estradiol Triphasic 0.18/0.215/0.25 MG-25 MCG tab Take 1 tablet by mouth daily.   Yes [provider]  predniSONE (DELTASONE) 20 MG tablet Take 2 tablets (40 mg total) by mouth daily with breakfast for 5 days. 06/26/21 07/01/21 Yes Tomi Bamberger, PA-C    Family History No family history on file.  Social History Social History   Tobacco Use   Smoking status: Never   Smokeless tobacco: Never  Substance Use Topics   Alcohol use: Yes    Comment: occassional   Drug use: Never     Allergies   Latex, Shellfish allergy, and Sulfa  antibiotics   Review of Systems Review of Systems  Constitutional:  Negative for chills and fever.  HENT:  Positive for congestion, ear pain and sore throat.   Eyes:  Negative for discharge and redness.  Respiratory:  Positive for cough and shortness of breath. Negative for wheezing.   Gastrointestinal:  Negative for abdominal pain, diarrhea, nausea and vomiting.    Physical Exam Triage Vital Signs ED Triage Vitals  Enc Vitals Group     BP      Pulse      Resp      Temp      Temp src      SpO2      Weight      Height      Head Circumference      Peak Flow      Pain Score      Pain Loc      Pain Edu?      Excl. in GC?    No data found.  Updated Vital Signs BP 121/76 (BP Location: Left Arm)    Pulse 73    Temp 98.1 F (36.7 C) (Oral)    Resp 18    Ht 5\' 5"  (1.651 m)    Wt 230 lb (104.3 kg)    LMP 06/05/2021    SpO2 95%    BMI 38.27  kg/m      Physical Exam Vitals and nursing note reviewed.  Constitutional:      General: She is not in acute distress.    Appearance: Normal appearance. She is not ill-appearing.  HENT:     Head: Normocephalic and atraumatic.     Left Ear: Tympanic membrane normal.     Ears:     Comments: Right TM erythematous    Nose: Congestion present.     Mouth/Throat:     Mouth: Mucous membranes are moist.     Pharynx: Posterior oropharyngeal erythema present. No oropharyngeal exudate.  Eyes:     Conjunctiva/sclera: Conjunctivae normal.  Cardiovascular:     Rate and Rhythm: Normal rate and regular rhythm.     Heart sounds: Normal heart sounds. No murmur heard. Pulmonary:     Effort: Pulmonary effort is normal. No respiratory distress.     Breath sounds: Normal breath sounds. No wheezing, rhonchi or rales.  Skin:    General: Skin is warm and dry.  Neurological:     Mental Status: She is alert.  Psychiatric:        Mood and Affect: Mood normal.        Thought Content: Thought content normal.     UC Treatments / Results  Labs (all  labs ordered are listed, but only abnormal results are displayed) Labs Reviewed - No data to display  EKG   Radiology No results found.  Procedures Procedures (including critical care time)  Medications Ordered in UC Medications - No data to display  Initial Impression / Assessment and Plan / UC Course  I have reviewed the triage vital signs and the nursing notes.  Pertinent labs & imaging results that were available during my care of the patient were reviewed by me and considered in my medical decision making (see chart for details).  Augmentin prescribed for otitis media.  Suspect likely bronchitis as well and will treat with prednisone burst.  Recommended follow-up if symptoms fail to improve with treatment or worsen.  Final Clinical Impressions(s) / UC Diagnoses   Final diagnoses:  Other acute nonsuppurative otitis media of right ear, recurrence not specified  Acute bronchitis, unspecified organism   Discharge Instructions   None    ED Prescriptions     Medication Sig Dispense Auth. Provider   amoxicillin-clavulanate (AUGMENTIN) 875-125 MG tablet Take 1 tablet by mouth every 12 (twelve) hours. 14 tablet Erma Pinto F, PA-C   predniSONE (DELTASONE) 20 MG tablet Take 2 tablets (40 mg total) by mouth daily with breakfast for 5 days. 10 tablet Tomi Bamberger, PA-C      PDMP not reviewed this encounter.   Tomi Bamberger, PA-C 06/26/21 [phone]

## 2022-05-27 ENCOUNTER — Encounter (INDEPENDENT_AMBULATORY_CARE_PROVIDER_SITE_OTHER): Payer: Self-pay

## 2022-08-31 ENCOUNTER — Encounter (INDEPENDENT_AMBULATORY_CARE_PROVIDER_SITE_OTHER): Payer: Self-pay | Admitting: Family Medicine

## 2022-08-31 ENCOUNTER — Ambulatory Visit (INDEPENDENT_AMBULATORY_CARE_PROVIDER_SITE_OTHER): Payer: Managed Care, Other (non HMO) | Admitting: Family Medicine

## 2022-08-31 VITALS — BP 120/86 | HR 95 | Temp 98.2°F | Ht 65.0 in | Wt 238.0 lb

## 2022-08-31 DIAGNOSIS — Z6839 Body mass index (BMI) 39.0-39.9, adult: Secondary | ICD-10-CM | POA: Diagnosis not present

## 2022-08-31 DIAGNOSIS — R632 Polyphagia: Secondary | ICD-10-CM

## 2022-08-31 DIAGNOSIS — E669 Obesity, unspecified: Secondary | ICD-10-CM | POA: Insufficient documentation

## 2022-08-31 DIAGNOSIS — E65 Localized adiposity: Secondary | ICD-10-CM | POA: Diagnosis not present

## 2022-08-31 DIAGNOSIS — Z0289 Encounter for other administrative examinations: Secondary | ICD-10-CM

## 2022-08-31 NOTE — Assessment & Plan Note (Signed)
Patient has had short-term success on phentermine in the past without adverse side effect.  She denies meal skipping.  We discussed the importance of eating on a schedule, lean protein and fiber with meals.  Will assess need for use of antiobesity medication after second visit.

## 2022-08-31 NOTE — Progress Notes (Signed)
Office: [phone]  /  Fax: [phone]   Initial Visit  Leslie Holt was seen in clinic today to evaluate for obesity. She is interested in losing weight to improve overall health and reduce the risk of weight related complications. She presents today to review program treatment options, initial physical assessment, and evaluation.     She was referred by: PCP  When asked what else they would like to accomplish? She states: Improve quality of life, Improve appearance, and Improve self-confidence  Weight history: 2018 weighed 160 lb Came out of the [employer] in 2006 Lost weight 2010--2018 Moved and pandemic- weight has increased  When asked how has your weight affected you? She states: Having fatigue  Some associated conditions: None  Contributing factors: Family history, Stress, and Reduced physical activity  Weight promoting medications identified: None  Current nutrition plan: None  Current level of physical activity: None and Other: doing just dance on X box, martial arts 2 x a week with boyfriend at home  and some hiking  Current or previous pharmacotherapy: Phentermine  Response to medication: Lost weight and was able to maintain weight loss   Past medical history includes:  History reviewed. No pertinent past medical history.   Objective:   BP 120/86   Pulse 95   Temp 98.2 F (36.8 C)   Ht '5\' 5"'$  (1.651 m)   Wt 238 lb (108 kg)   SpO2 96%   BMI 39.61 kg/m  She was weighed on the bioimpedance scale: Body mass index is 39.61 kg/m.  Peak Weight:238 lb , Body Fat%:47.5%, Visceral Fat Rating:13, Weight trend over the last 12 months: Unchanged  General:  Alert, oriented and cooperative. Patient is in no acute distress.  Respiratory: Normal respiratory effort, no problems with respiration noted   Gait: able to ambulate independently  Mental Status: Normal mood and affect. Normal behavior. Normal judgment and thought content.   DIAGNOSTIC DATA  REVIEWED:  BMET No results found for: "NA", "K", "CL", "CO2", "GLUCOSE", "BUN", "CREATININE", "CALCIUM", "GFRNONAA", "GFRAA" No results found for: "HGBA1C" No results found for: "INSULIN" CBC No results found for: "WBC", "RBC", "HGB", "HCT", "PLT", "MCV", "MCH", "MCHC", "RDW" Iron/TIBC/Ferritin/ %Sat No results found for: "IRON", "TIBC", "FERRITIN", "IRONPCTSAT" Lipid Panel  No results found for: "CHOL", "TRIG", "HDL", "CHOLHDL", "VLDL", "LDLCALC", "LDLDIRECT" Hepatic Function Panel  No results found for: "PROT", "ALBUMIN", "AST", "ALT", "ALKPHOS", "BILITOT", "BILIDIR", "IBILI" No results found for: "TSH"   Assessment and Plan:   Obesity,current BMI 39.7        Obesity Treatment / Action Plan:  Patient will work on garnering support from family and friends to begin weight loss journey. Will work on eliminating or reducing the presence of highly palatable, calorie dense foods in the home. Will complete provided nutritional and psychosocial assessment questionnaire before the next appointment. Will be scheduled for indirect calorimetry to determine resting energy expenditure in a fasting state.  This will allow [place] to create a reduced calorie, high-protein meal plan to promote loss of fat mass while preserving muscle mass. Will think about ideas on how to incorporate physical activity into their daily routine. Will avoid skipping meals which may result in increased hunger signals and overeating at certain times. Will work on managing stress via relaxation methods as this may result in unhealthy eating patterns. Was counseled on nutritional approaches to weight loss and benefits of complex carbs and high quality protein as part of nutritional weight management. Was counseled on pharmacotherapy and role as an adjunct in  weight management.   Obesity Education Performed Today:  She was weighed on the bioimpedance scale and results were discussed and documented in the synopsis.  We  discussed obesity as a disease and the importance of a more detailed evaluation of all the factors contributing to the disease.  We discussed the importance of long term lifestyle changes which include nutrition, exercise and behavioral modifications as well as the importance of customizing this to her specific health and social needs.  We discussed the benefits of reaching a healthier weight to alleviate the symptoms of existing conditions and reduce the risks of the biomechanical, metabolic and psychological effects of obesity.  Leslie Holt appears to be in the action stage of change and states they are ready to start intensive lifestyle modifications and behavioral modifications.  30 minutes was spent today on this visit including the above counseling, pre-visit chart review, and post-visit documentation.  Reviewed by clinician on day of visit: allergies, medications, problem list, medical history, surgical history, family history, social history, and previous encounter notes pertinent to obesity diagnosis.    Loyal Gambler, D.O.

## 2022-08-31 NOTE — Assessment & Plan Note (Signed)
Reviewed results of bioimpedance results with a visceral fat rating of 13.  We discussed a goal visceral fat rating less than 10.  Anticipate improvements with weight reduction and creation of a caloric deficit.  Will obtain her basal metabolic rate next visit with indirect calorimetry.

## 2022-09-23 ENCOUNTER — Encounter (INDEPENDENT_AMBULATORY_CARE_PROVIDER_SITE_OTHER): Payer: Self-pay | Admitting: Family Medicine

## 2022-09-23 ENCOUNTER — Ambulatory Visit (INDEPENDENT_AMBULATORY_CARE_PROVIDER_SITE_OTHER): Payer: Managed Care, Other (non HMO) | Admitting: Family Medicine

## 2022-09-23 VITALS — BP 105/70 | HR 83 | Temp 98.2°F | Ht 65.0 in | Wt 238.8 lb

## 2022-09-23 DIAGNOSIS — F908 Attention-deficit hyperactivity disorder, other type: Secondary | ICD-10-CM | POA: Diagnosis not present

## 2022-09-23 DIAGNOSIS — Z1331 Encounter for screening for depression: Secondary | ICD-10-CM

## 2022-09-23 DIAGNOSIS — K219 Gastro-esophageal reflux disease without esophagitis: Secondary | ICD-10-CM

## 2022-09-23 DIAGNOSIS — Z6839 Body mass index (BMI) 39.0-39.9, adult: Secondary | ICD-10-CM

## 2022-09-23 DIAGNOSIS — R5383 Other fatigue: Secondary | ICD-10-CM | POA: Diagnosis not present

## 2022-09-23 DIAGNOSIS — R0602 Shortness of breath: Secondary | ICD-10-CM

## 2022-09-23 DIAGNOSIS — F419 Anxiety disorder, unspecified: Secondary | ICD-10-CM

## 2022-09-23 DIAGNOSIS — F39 Unspecified mood [affective] disorder: Secondary | ICD-10-CM

## 2022-09-23 DIAGNOSIS — F988 Other specified behavioral and emotional disorders with onset usually occurring in childhood and adolescence: Secondary | ICD-10-CM | POA: Insufficient documentation

## 2022-09-23 NOTE — Progress Notes (Signed)
Leslie Holt, D.O.  ABFM, ABOM Specializing in Clinical Bariatric Medicine Office located at: [address], [place]  [address]     Initial Bariatric Medicine Consultation Visit  Dear Leslie Hibbs, NP   Thank you for referring Leslie Holt to our clinic today for evaluation.  We performed a consultation to discuss her options for treatment and educate the patient on her disease state.  The following note includes my evaluation and treatment recommendations.   Please do not hesitate to reach out to me directly if you have any further concerns.    Assessment and Plan:   Orders Placed This Encounter  Procedures   CBC with Differential/Platelet   Comprehensive metabolic panel   Hemoglobin A1c   Insulin, random   Lipid Panel With LDL/HDL Ratio   T4, free   TSH   Vitamin B12   VITAMIN D 25 Hydroxy (Vit-D Deficiency, Fractures)   EKG 12-Lead    Medications Discontinued During This Encounter  Medication Reason   amoxicillin-clavulanate (AUGMENTIN) 875-125 MG tablet Completed Course     1) Fatigue Assessment: Condition is Not optimized. Leslie Holt does feel that her weight is causing her energy to be lower than it should be. Fatigue may be related to obesity, depression or many other causes. She does not appear to have any red flag symptoms and this appears to most likely related to her current lifestyle habits and dietary intake.  Plan: Labs will be ordered and reviewed with her at their next office visit in two weeks In the meanwhile, Leslie Holt will focus on self care including making healthy food choices by following their meal plan, improving sleep quality and focusing on stress reduction.  Once we are assured she is on an appropriate meal plan, we will start discussing exercise to increase cardiovascular fitness levels.    2) Shortness of breath on exertion Assessment: Condition is Not optimized. Leslie Holt does feel that she gets out of breath more easily than she  used to when she exercises and seems to be worsening over time with weight gain.  This has gotten worse recently. Leslie Holt denies shortness of breath at rest or orthopnea. Leslie Holt's shortness of breath appears to be obesity related and exercise induced, as they do not appear to have any "red flag" symptoms/ concerns today.    Plan: Patient agreed to work on weight loss at this time.  As Leslie Holt progresses through our weight loss program, we will gradually increase exercise as tolerated to treat her current condition.   If Leslie Holt follows our recommendations and loses 5-10% of their weight without improvement of her shortness of breath or if at any time, symptoms become more concerning, they agree to urgently follow up with their PCP/ specialist for further consideration/ evaluation.   Leslie Holt verbalizes agreement with this plan.    Gastroesophageal reflux disease, unspecified whether esophagitis present Assessment: Condition is stable.  Patient is compliant with Prilosec 10 mg daily. Denies any side effects.  Plan: Continue with med as recommended by PCP/specialist. Begin her prudent nutritional plan and continue to advance exercise and cardiovascular fitness as tolerated.    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 No SI/HI. Patient endorses that she eats when stressed, sad, and to comfort herself. She also endorses having impulse food decisions. She is compliant with Wellbutrin XL 300 mg daily and Lexapro 10 mg daily. Denies any side effects.She does not have any counselor for anxiety or depression, she has met with a  few in the past but it has not worked for her.  Plan: Continue with meds as recommended by PCP/specialist.  I recommended Dr.Barker, our bariatric psychologist, for assistance with the mood disorder-emotional eating. She declined for now.    TREATMENT PLAN FOR OBESITY: Assessment: Condition is Not optimized.  Biometric data collected today, was reviewed  with patient.  Fat mass has increased by .4lb. Muscle mass has not changed. Total body water has decreased by 3.2lb.   Plan: Leslie Holt will work on healthier eating habits and try their best to follow the low carbohydrate plan best they can.   Behavioral Intervention Additional resources provided today:  low carbohydrate meal plan Evidence-based interventions for health behavior change were utilized today including the discussion of self monitoring techniques, problem-solving barriers and SMART goal setting techniques.   Regarding patient's less desirable eating habits and patterns, we employed the technique of small changes.  Pt will specifically work on: following the low carbohydrate meal plan for next visit.    Recommended Physical Activity Goals Leslie Holt has been advised to work up to 150 minutes of moderate intensity aerobic activity a week and strengthening exercises 2-3 times per week for cardiovascular health, weight loss maintenance and preservation of muscle mass.  She has agreed to continue their current level of activity  FOLLOW UP: Follow up in 2 weeks. She was informed of the importance of frequent follow up visits to maximize her success with intensive lifestyle modifications for her multiple health conditions.  Leslie Holt is aware that we will review all of her lab results at our next visit.  She is aware that if anything is critical/ life threatening with the results, we will be contacting her via [place] prior to the office visit to discuss management.    Chief Complaint:   OBESITY Leslie Holt (MR# [id number]) is a 42 y.o. female who presents for evaluation and treatment of obesity and related comorbidities. Current BMI is Body mass index is 39.74 kg/m. Leslie Holt has been struggling with her weight for many years and has been unsuccessful in either losing weight, maintaining weight loss, or reaching her healthy weight goal.  Leslie Holt is currently in the action stage of change and  ready to dedicate time achieving and maintaining a healthier weight. Leslie Holt is interested in becoming our patient and working on intensive lifestyle modifications including (but not limited to) diet and exercise for weight loss.  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 Patient is in a long term relationship with Leslie Holt and has 1 child. She lives with Leslie Holt and her 34 y/o son (with cerebral palsy).   She had an info sesson with Dr.Bowen on 08/31/22  She exercises 3x a week and dances for 30 minutes.   She gained weight after leaving the [employer] and because of increased stress. She endorses she also gained weight during the pandemic.   She has tried a keto diet in the past, which worked best for her. She wants to lose 80 lbs in 8 months to 1 year.   She eats out 2x a week. She craves chocolate and snacks on yogurt and nuts. Her worst food habit is impulse food decisions. She eats when stressed, sad, and to comfort herself.    Depression Screen Her Food and Mood (modified PHQ-9) score was 17.  Sleep habits Her ESS score is 1.  Leslie Holt admits to daytime somnolence and admits to waking up still tired. Patient has a history of symptoms of daytime fatigue. Leslie Holt generally  gets 5 or 6 hours of sleep per night, and states that she does not have restful sleep. Snoring is present. Apneic episodes is not present.    Subjective:   This is the patient's first visit at [facility].  The patient's NEW PATIENT PACKET that they filled out prior to today's office visit was reviewed at length and information from that paperwork was included within the following office visit note.    Included in the packet: current and past health history, medications, allergies, ROS, gynecologic history (women only), surgical history, family history, social history, weight history, weight loss surgery history (for those that have had weight loss surgery), nutritional evaluation, mood and food questionnaire  along with a depression screening (PHQ9) on all patients, an Epworth questionnaire, sleep habits questionnaire, patient life and health improvement goals questionnaire. These will all be scanned into the patient's chart under the "media" tab.   Review of Systems: Please refer to new patient packet scanned into media. Pertinent positives were addressed with patient today.   Objective:   PHYSICAL EXAM: Blood pressure 105/70, pulse 83, temperature 98.2 F (36.8 C), height 5\' 5"  (1.651 m), weight 238 lb 12.8 oz (108.3 kg), SpO2 98 %. Body mass index is 39.74 kg/m. General: Well Developed, well nourished, and in no acute distress.  HEENT: Normocephalic, atraumatic Skin: Warm and dry, cap RF less 2 sec, good turgor Chest:  Normal excursion, shape, no gross abn Respiratory: speaking in full sentences, no conversational dyspnea NeuroM-Sk: Ambulates w/o assistance, moves * 4 Psych: A and O *3, insight good, mood-full  EKG: Normal sinus rhythm, rate 78 bpm.No acute abnormalities.   Indirect Calorimeter completed today shows a VO2 of 293 and a REE of 2016.  Her calculated basal metabolic rate is [id number] thus her measured basal metabolic rate is better than expected.  Anthropometric Measurements Height: 5\' 5"  (1.651 m) Weight: 238 lb 12.8 oz (108.3 kg) BMI (Calculated): 39.74 Starting Weight: 238.8lb Peak Weight: 243lb Waist Measurement : 44 inches   Body Composition  Body Fat %: 47.6 % Fat Mass (lbs): 113.6 lbs Muscle Mass (lbs): 118.8 lbs Total Body Water (lbs): 86 lbs Visceral Fat Rating : 13   Other Clinical Data RMR: 39.7 Fasting: yes Labs: yes Today's Visit #: 1 Starting Date: 09/23/22    DIAGNOSTIC DATA REVIEWED:  BMET No results found for: "NA", "K", "CL", "CO2", "GLUCOSE", "BUN", "CREATININE", "CALCIUM", "GFRNONAA", "GFRAA" No results found for: "HGBA1C" No results found for: "INSULIN" No results found for: "TSH" CBC No results found for: "WBC", "RBC", "HGB", "HCT",  "PLT", "MCV", "MCH", "MCHC", "RDW" Iron Studies No results found for: "IRON", "TIBC", "FERRITIN", "IRONPCTSAT" Lipid Panel  No results found for: "CHOL", "TRIG", "HDL", "CHOLHDL", "VLDL", "LDLCALC", "LDLDIRECT" Hepatic Function Panel  No results found for: "PROT", "ALBUMIN", "AST", "ALT", "ALKPHOS", "BILITOT", "BILIDIR", "IBILI" No results found for: "TSH" Nutritional No results found for: "VD25OH"  Attestation Statements:   Reviewed by clinician on day of visit: allergies, medications, problem list, medical history, surgical history, family history, social history, and previous encounter notes.  During the visit, I independently reviewed the patient's EKG, bioimpedance scale results, and indirect calorimeter results. I used this information to tailor a meal plan for the patient that will help Javonda Essig to lose weight and will improve her obesity-related conditions going forward.  I performed a medically necessary appropriate examination and/or evaluation. I discussed the assessment and treatment plan with the patient. The patient was provided an opportunity to ask questions and all  were answered. The patient agreed with the plan and demonstrated an understanding of the instructions. Labs were ordered today (unless patient declined them) and will be reviewed with the patient at our next visit unless more critical results need to be addressed immediately. Clinical information was updated and documented in the EMR.  Time spent on visit including pre-visit chart review and post-visit care was estimated to be 40 minutes.   I,Special Puri,acting as a Education administrator for [employer], DO.,have documented all relevant documentation on the behalf of Mellody Dance, DO,as directed by  Mellody Dance, DO while in the presence of Mellody Dance, DO.   I, Mellody Dance, DO, have reviewed all documentation for this visit. The documentation on 09/23/22 for the exam, diagnosis, procedures, and orders are all  accurate and complete.

## 2022-09-24 LAB — LIPID PANEL WITH LDL/HDL RATIO
Cholesterol, Total: 216 mg/dL — ABNORMAL HIGH (ref 100–199)
HDL: 60 mg/dL (ref >39)
LDL Chol Calc (NIH): 138 mg/dL — ABNORMAL HIGH (ref 0–99)
LDL/HDL Ratio: 2.3 ratio (ref 0.0–3.2)
Triglycerides: 103 mg/dL (ref 0–149)
VLDL Cholesterol Cal: 18 mg/dL (ref 5–40)

## 2022-09-24 LAB — VITAMIN B12: Vitamin B-12: 349 pg/mL (ref 232–1245)

## 2022-09-24 LAB — INSULIN, RANDOM: INSULIN: 18.8 u[IU]/mL (ref 2.6–24.9)

## 2022-09-24 LAB — VITAMIN D 25 HYDROXY (VIT D DEFICIENCY, FRACTURES): Vit D, 25-Hydroxy: 14.7 ng/mL — ABNORMAL LOW (ref 30.0–100.0)

## 2022-10-07 ENCOUNTER — Encounter (INDEPENDENT_AMBULATORY_CARE_PROVIDER_SITE_OTHER): Payer: Self-pay | Admitting: Family Medicine

## 2022-10-07 ENCOUNTER — Ambulatory Visit (INDEPENDENT_AMBULATORY_CARE_PROVIDER_SITE_OTHER): Payer: Managed Care, Other (non HMO) | Admitting: Family Medicine

## 2022-10-07 VITALS — BP 119/79 | HR 82 | Temp 98.1°F | Ht 65.0 in | Wt 233.8 lb

## 2022-10-07 DIAGNOSIS — E88819 Insulin resistance, unspecified: Secondary | ICD-10-CM | POA: Insufficient documentation

## 2022-10-07 DIAGNOSIS — E7849 Other hyperlipidemia: Secondary | ICD-10-CM | POA: Insufficient documentation

## 2022-10-07 DIAGNOSIS — F39 Unspecified mood [affective] disorder: Secondary | ICD-10-CM

## 2022-10-07 DIAGNOSIS — Z6839 Body mass index (BMI) 39.0-39.9, adult: Secondary | ICD-10-CM

## 2022-10-07 DIAGNOSIS — E538 Deficiency of other specified B group vitamins: Secondary | ICD-10-CM | POA: Insufficient documentation

## 2022-10-07 DIAGNOSIS — E559 Vitamin D deficiency, unspecified: Secondary | ICD-10-CM | POA: Insufficient documentation

## 2022-10-07 DIAGNOSIS — E785 Hyperlipidemia, unspecified: Secondary | ICD-10-CM

## 2022-10-07 DIAGNOSIS — Z6838 Body mass index (BMI) 38.0-38.9, adult: Secondary | ICD-10-CM

## 2022-10-07 MED ORDER — VITAMIN D (ERGOCALCIFEROL) 1.25 MG (50000 UNIT) PO CAPS: 50000.0000 [IU] | ORAL_CAPSULE | ORAL | 0 refills | Status: DC

## 2022-10-07 MED ORDER — CYANOCOBALAMIN 500 MCG PO TABS: ORAL_TABLET | ORAL | Status: DC

## 2022-10-07 NOTE — Progress Notes (Signed)
Carlye Grippeeborah J. Jonan Seufert, D.O.  ABFM, ABOM Specializing in Clinical Bariatric Medicine  Office located at: [address]  [place], [place]  [phone]     Assessment and Plan:   Orders Placed This Encounter  Procedures   CBC with Differential/Platelet   Comprehensive metabolic panel   Hemoglobin A1c   TSH   T4, free    Meds ordered this encounter  Medications   Vitamin D, Ergocalciferol, (DRISDOL) 1.25 MG (50000 UNIT) CAPS capsule    Sig: Take 1 capsule (50,000 Units total) by mouth every 7 (seven) days.    Dispense:  4 capsule    Refill:  0   cyanocobalamin (VITAMIN B12) 500 MCG tablet    Sig: 300-500 mcg daily   Check TSH, T4, A1c, CMP, and CBC today. Lab did not draw these in last OV   B12 deficiency due to diet Assessment: Condition is new and  Not optimized. Lab Results  Component Value Date   VITAMINB12 349 09/23/2022  I informed the patient that her B12 levels are too low and that we want it to be atleast 500. She is currently not on any B12 supplement. 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   Plan:Begin Vitamin B12 300-500 mcg daily. Continue her prudent nutritional plan and continue to advance exercise and cardiovascular fitness as tolerated.      Mood disorder ([facility]CC)- Emotional Eating Assessment: Condition is Not optimized. Patient endorses she has been having hunger and cravings. She craves donuts, cakes, and cupcakes. She is compliant with Wellbutrin XL 300 mg daily and Lexapro 10 mg daily. Denies any side effects.  Plan:Continue with meds as recommended by PCP/Specialist Continue her prudent nutritional plan and continue to advance exercise and cardiovascular fitness as tolerated.     Vitamin D deficiency Assessment: Condition is new and Not optimized.. Labs were reviewed. Lab Results  Component Value Date   VD25OH 14.7 (L) 09/23/2022  Patient is currently not taking any Vitamin D supplement. This is currently diet controlled. I reviewed with the patient  that her Vitamin D levels are not at goal. I informed her that the levels should be greater than 50.   Plan: Begin Ergocalciferol 50K IU weekly  - I reviewed possible symptoms of low Vitamin D:  low energy, depressed mood, muscle aches, joint aches, osteoporosis etc. was reviewed with patient - low Vitamin D levels may be linked to an increased risk of cardiovascular events and even increased risk of cancers- such as colon and breast.  - weight loss will likely improve availability of vitamin D, thus encouraged Lysle Dingwallorah to continue with meal plan and their weight loss efforts to further improve this condition.  Thus, we will need to monitor levels regularly (every 3-4 mo on average) to keep levels within normal limits and prevent over supplementation.   Insulin Resistance Assessment: Condition is new and  Not optimized.. Labs were reviewed.  Lab Results  Component Value Date   INSULIN 18.8 09/23/2022  I reviewed with the patient that her insulin levels is about 3 times over the normal amount. I informed her that it should be less than 5. Patient endorses she has been having hunger and cravings. She craves donuts, cakes, and cupcakes.   Plan: - We discussed the possibility of starting on Metformin in the future if her carb cravings persist.  For now, she will continue adherence to nutritional plan.  - Continue to decrease simple carbs/ sugars; increase fiber and proteins -> follow her meal plan.   -  Explained role of simple carbs and insulin levels on hunger and cravings - Handouts provided at pt's request after education provided.  All concerns/questions addressed.   Siana Alexis will continue to work on weight loss, exercise, via their meal plan we devised to help decrease the risk of progressing to diabetes.  - We will fasting insulin level in approximately 3 months from last check, or as deemed appropriate.     Hyperlipidemia Assessment: Condition is new and  Not optimized.. Labs were reviewed.   Lab Results  Component Value Date   CHOL 216 (H) 09/23/2022   HDL 60 09/23/2022   LDLCALC 138 (H) 09/23/2022   TRIG 103 09/23/2022  I informed the patient that her LDL and cholesterol levels are above normal. I also informed that her HDL and Triglyceride levels are exceptionally good. The 10-year ASCVD risk score (Arnett DK, et al., 2019) is: 0.6%   Values used to calculate the score:     Age: 66 years     Sex: Female     Is Non-Hispanic African American: No     Diabetic: No     Tobacco smoker: No     Systolic Blood Pressure: 119 mmHg     Is BP treated: No     HDL Cholesterol: 60 mg/dL     Total Cholesterol: 216 mg/dL   Plan: Continue her prudent nutritional plan and continue to advance exercise and cardiovascular fitness as tolerated.  - I stressed the importance that patient continue with our prudent nutritional plan that is low in saturated and trans fats, and low in fatty carbs to improve these numbers.   TREATMENT PLAN FOR OBESITY: BMI 39.0-39.9,adult-current bmi 38.9 Morbid obesity (HCC)-start bmi 39.7/date 09/23/22 Assessment: Condition is improving, but not optimized.  Biometric data collected today, was reviewed with patient.  Fat mass has decreased by 7.4lb. Muscle mass has increased by 2.4lb. Total body water has decreased by 1.8 lb.   Plan:  Sumar is currently in the action stage of change. As such, her goal is to continue weight management plan. Sabrinna will work on healthier eating habits and try their best to continue following the lower carbohydrate, vegetable and lean protein rich diet plan best they can.  I informed her that eating berries, chocolate, and alcohol can possibly slow her weight loss. I informed her about low glycemic fruits that she may have.  I encouraged the patient about the importance of drinking approximately half of their body weight ( in pounds) in ounces of water daily.    Behavioral Intervention Additional resources provided today:  low  carb plan , insulin resistance handout Evidence-based interventions for health behavior change were utilized today including the discussion of self monitoring techniques, problem-solving barriers and SMART goal setting techniques.   Regarding patient's less desirable eating habits and patterns, we employed the technique of small changes.  Pt will specifically work on: continuing to follow the lower carb plan for next visit.    Recommended Physical Activity Goals Dung has been advised to work up to 150 minutes of moderate intensity aerobic activity a week and strengthening exercises 2-3 times per week for cardiovascular health, weight loss maintenance and preservation of muscle mass.  She has agreed to Continue current level of physical activity   FOLLOW UP: Return in about 2 weeks (around 10/21/2022). She was informed of the importance of frequent follow up visits to maximize her success with intensive lifestyle modifications for her multiple health conditions.  Kyre Krantz is aware  that we will review all of her lab results at our next visit.  She is aware that if anything is critical/ life threatening with the results, we will be contacting her via MyChart prior to the office visit to discuss management.    Subjective:   Chief complaint: Obesity Ameliah is here to discuss her progress with her obesity treatment plan. She is on the following a lower carbohydrate, vegetable and lean protein rich diet plan and states she is following her eating plan approximately 90 % of the time. She states she is dancing and doing cardio 30 minutes 3 days per week.  Interval History:  Jehieli Brassell is here today for her first follow-up office visit since starting the program with [place].  Since last office visit she states it has been relatively easy for her to adapt to her meal plan. She feels that eating this way is sustainable for her life style. Her biggest challenge is avoiding using sour creams for certain  recipes. This past weekend she want traveling and made healthier choices when eating out, such as eating veggies, meat (southern spiced chicken breast), and ham/cheese sandwiches. 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 She is drinking about 65-70 oz of water daily.  All blood work/ lab tests that were recently ordered by myself or an outside provider were reviewed with patient today per their request. Extended time was spent counseling her on all new disease processes that were discovered or preexisting ones that are affected by BMI.  she understands that many of these abnormalities will need to monitored regularly along with the current treatment plan of prudent dietary changes, in which we are making each and every office visit, to improve these health parameters.    Review of Systems:  Pertinent positives were addressed with patient today.   Weight Summary and Biometrics   Weight Lost Since Last Visit: 5lb  No data recorded  Vitals Temp: 98.1 F (36.7 C) BP: 119/79 Pulse Rate: 82 SpO2: 98 %   Anthropometric Measurements Height: 5\' 5"  (1.651 m) Weight: 233 lb 12.8 oz (106.1 kg) BMI (Calculated): 38.91 Weight at Last Visit: 238lb Weight Lost Since Last Visit: 5lb Starting Weight: 238.8lb Total Weight Loss (lbs): 5 lb (2.268 kg) Peak Weight: 243lb   Body Composition  Body Fat %: 45.4 % Fat Mass (lbs): 106.2 lbs Muscle Mass (lbs): 121.2 lbs Total Body Water (lbs): 84.2 lbs Visceral Fat Rating : 12   Other Clinical Data Fasting: no Labs: no Today's Visit #: 2 Starting Date: 09/23/22     Objective:   PHYSICAL EXAM:  Blood pressure 119/79, pulse 82, temperature 98.1 F (36.7 C), height 5\' 5"  (1.651 m), weight 233 lb 12.8 oz (106.1 kg), SpO2 98 %. Body mass index is 38.91 kg/m.  General: Well Developed, well nourished, and in no acute distress.  HEENT: Normocephalic,  atraumatic Skin: Warm and dry, cap RF less 2 sec, good turgor Chest:  Normal excursion, shape, no gross abn Respiratory: speaking in full sentences, no conversational dyspnea NeuroM-Sk: Ambulates w/o assistance, moves * 4 Psych: A and O *3, insight good, mood-full  DIAGNOSTIC DATA REVIEWED:  BMET No results found for: "NA", "K", "CL", "CO2", "GLUCOSE", "BUN", "CREATININE", "CALCIUM", "GFRNONAA", "GFRAA" No results found for: "HGBA1C" Lab Results  Component Value Date   INSULIN 18.8 09/23/2022   No results found for: "TSH" CBC No results found for: "  WBC", "RBC", "HGB", "HCT", "PLT", "MCV", "MCH", "MCHC", "RDW" Iron Studies No results found for: "IRON", "TIBC", "FERRITIN", "IRONPCTSAT" Lipid Panel     Component Value Date/Time   CHOL 216 (H) 09/23/2022 0859   TRIG 103 09/23/2022 0859   HDL 60 09/23/2022 0859   LDLCALC 138 (H) 09/23/2022 0859   Hepatic Function Panel  No results found for: "PROT", "ALBUMIN", "AST", "ALT", "ALKPHOS", "BILITOT", "BILIDIR", "IBILI" No results found for: "TSH" Nutritional Lab Results  Component Value Date   VD25OH 14.7 (L) 09/23/2022    Attestations:   Reviewed by clinician on day of visit: allergies, medications, problem list, medical history, surgical history, family history, social history, and previous encounter notes.   Patient was in the office today and time spent on visit including pre-visit chart review and post-visit care/coordination of care and electronic medical record documentation was 55 minutes. 50% of the time was in face to face counseling of this patient's medical condition(s) and providing education on treatment options to include the first-line treatment of diet and lifestyle modification.   I,Special Puri,acting as a Neurosurgeon for [employer], DO.,have documented all relevant documentation on the behalf of Thomasene Lot, DO,as directed by  Thomasene Lot, DO while in the presence of Thomasene Lot, DO.   I, Thomasene Lot, DO, have reviewed all documentation for this visit. The documentation on 10/07/22 for the exam, diagnosis, procedures, and orders are all accurate and complete.

## 2022-10-08 LAB — COMPREHENSIVE METABOLIC PANEL
ALT: 21 IU/L (ref 0–32)
AST: 18 IU/L (ref 0–40)
Albumin/Globulin Ratio: 1.6 (ref 1.2–2.2)
Albumin: 4.2 g/dL (ref 3.9–4.9)
Alkaline Phosphatase: 121 IU/L (ref 44–121)
BUN/Creatinine Ratio: 21 (ref 9–23)
BUN: 16 mg/dL (ref 6–24)
Bilirubin Total: <0.2 mg/dL (ref 0.0–1.2)
CO2: 20 mmol/L (ref 20–29)
Calcium: 9.6 mg/dL (ref 8.7–10.2)
Chloride: 101 mmol/L (ref 96–106)
Creatinine, Ser: 0.78 mg/dL (ref 0.57–1.00)
Globulin, Total: 2.6 g/dL (ref 1.5–4.5)
Glucose: 87 mg/dL (ref 70–99)
Potassium: 4.9 mmol/L (ref 3.5–5.2)
Sodium: 140 mmol/L (ref 134–144)
Total Protein: 6.8 g/dL (ref 6.0–8.5)
eGFR: 97 mL/min/{1.73_m2} (ref >59)

## 2022-10-08 LAB — CBC WITH DIFFERENTIAL/PLATELET
Basophils Absolute: 0.1 10*3/uL (ref 0.0–0.2)
Basos: 1 %
EOS (ABSOLUTE): 0.3 10*3/uL (ref 0.0–0.4)
Eos: 2 %
Hematocrit: 41 % (ref 34.0–46.6)
Hemoglobin: 13.4 g/dL (ref 11.1–15.9)
Immature Grans (Abs): 0 10*3/uL (ref 0.0–0.1)
Immature Granulocytes: 0 %
Lymphocytes Absolute: 3 10*3/uL (ref 0.7–3.1)
Lymphs: 24 %
MCH: 29.5 pg (ref 26.6–33.0)
MCHC: 32.7 g/dL (ref 31.5–35.7)
MCV: 90 fL (ref 79–97)
Monocytes Absolute: 0.9 10*3/uL (ref 0.1–0.9)
Monocytes: 7 %
Neutrophils Absolute: 8.1 10*3/uL — ABNORMAL HIGH (ref 1.4–7.0)
Neutrophils: 66 %
Platelets: 699 10*3/uL — ABNORMAL HIGH (ref 150–450)
RBC: 4.55 x10E6/uL (ref 3.77–5.28)
RDW: 13.1 % (ref 11.7–15.4)
WBC: 12.3 10*3/uL — ABNORMAL HIGH (ref 3.4–10.8)

## 2022-10-08 LAB — HEMOGLOBIN A1C
Est. average glucose Bld gHb Est-mCnc: 128 mg/dL
Hgb A1c MFr Bld: 6.1 % — ABNORMAL HIGH (ref 4.8–5.6)

## 2022-10-08 LAB — T4, FREE: Free T4: 1.35 ng/dL (ref 0.82–1.77)

## 2022-10-08 LAB — TSH: TSH: 0.938 u[IU]/mL (ref 0.450–4.500)

## 2022-10-22 ENCOUNTER — Encounter (INDEPENDENT_AMBULATORY_CARE_PROVIDER_SITE_OTHER): Payer: Self-pay | Admitting: Family Medicine

## 2022-10-22 ENCOUNTER — Ambulatory Visit (INDEPENDENT_AMBULATORY_CARE_PROVIDER_SITE_OTHER): Payer: Managed Care, Other (non HMO) | Admitting: Family Medicine

## 2022-10-22 VITALS — BP 123/74 | HR 85 | Temp 97.7°F | Ht 65.0 in | Wt 233.0 lb

## 2022-10-22 DIAGNOSIS — Z6839 Body mass index (BMI) 39.0-39.9, adult: Secondary | ICD-10-CM

## 2022-10-22 DIAGNOSIS — E88819 Insulin resistance, unspecified: Secondary | ICD-10-CM

## 2022-10-22 DIAGNOSIS — E559 Vitamin D deficiency, unspecified: Secondary | ICD-10-CM | POA: Diagnosis not present

## 2022-10-22 DIAGNOSIS — E538 Deficiency of other specified B group vitamins: Secondary | ICD-10-CM

## 2022-10-22 DIAGNOSIS — Z6838 Body mass index (BMI) 38.0-38.9, adult: Secondary | ICD-10-CM

## 2022-10-22 MED ORDER — VITAMIN D (ERGOCALCIFEROL) 1.25 MG (50000 UNIT) PO CAPS: 50000.0000 [IU] | ORAL_CAPSULE | ORAL | 0 refills | Status: DC

## 2022-10-22 NOTE — Progress Notes (Signed)
Leslie Holt, D.O.  ABFM, ABOM Specializing in Clinical Bariatric Medicine  Office located at: [address] [place], [place]  [address]     Assessment and Plan:   No orders of the defined types were placed in this encounter.   Medications Discontinued During This Encounter  Medication Reason   Vitamin D, Ergocalciferol, (DRISDOL) 1.25 MG (50000 UNIT) CAPS capsule Reorder     Meds ordered this encounter  Medications   Vitamin D, Ergocalciferol, (DRISDOL) 1.25 MG (50000 UNIT) CAPS capsule    Sig: Take 1 capsule (50,000 Units total) by mouth every 7 (seven) days.    Dispense:  4 capsule    Refill:  0    Vitamin D deficiency Assessment: Condition is not optimized.  Lab Results  Component Value Date   VD25OH 14.7 (L) 09/23/2022  - She reports good compliance and tolerance with Ergocalciferol 50K IU weekly. Denies any side effects.   Plan:  - Continue with med as prescribed. Will refill this today.    Continue her prudent nutritional plan that is low in simple carbohydrates, saturated fats and trans fats to goal of 5-10% weight loss to achieve significant health benefits.  Pt encouraged to continually advance exercise and cardiovascular fitness as tolerated throughout weight loss journey.    Insulin resistance Assessment: Condition is Not optimized. Labs were reviewed.  Lab Results  Component Value Date   HGBA1C 6.1 (H) 10/07/2022   INSULIN 18.8 09/23/2022   Lab Results  Component Value Date   WBC 12.3 (H) 10/07/2022   HGB 13.4 10/07/2022   HCT 41.0 10/07/2022   MCV 90 10/07/2022   PLT 699 (H) 10/07/2022   Lab Results  Component Value Date   CREATININE 0.78 10/07/2022   BUN 16 10/07/2022   NA 140 10/07/2022   K 4.9 10/07/2022   CL 101 10/07/2022   CO2 20 10/07/2022      Component Value Date/Time   PROT 6.8 10/07/2022 1252   ALBUMIN 4.2 10/07/2022 1252   AST 18 10/07/2022 1252   ALT 21 10/07/2022 1252   ALKPHOS 121 10/07/2022 1252   BILITOT  <0.2 10/07/2022 1252   Lab Results  Component Value Date   TSH 0.938 10/07/2022   FREET4 1.35 10/07/2022  - Patients A1c levels are within the Prediabetic range. Patient declines wanting to start Metformin.  - Patient is not on any medication for this condition. This is diet controlled - Blood counts indicate that she is not anemic.  - Liver enzymes show no evidence of fatty liver.  - Thyroid labs indicate that there is no issue with her Thyroid function.   Plan: - I informed her that her A1c levels and Insulin levels are likely to decrease as she continues to follow the meal plan and lose weight.  - Continue to decrease simple carbs/ sugars; increase fiber and proteins -> follow her meal plan.   [name] will continue to work on weight loss, exercise, via their meal plan we devised to help decrease the risk of progressing to diabetes.  - We will recheck A1c and fasting insulin level in approximately 3 months from last check, or as deemed appropriate.     B12 deficiency due to diet Assessment: Condition is not optimized. Lab Results  Component Value Date   VITAMINB12 349 09/23/2022  - Patient started OTC Vitamin B12 300-500 mcg daily since last OV.  - She endorses that her stomach was upset when she began the B12 for the  first week, but has been tolerating it well now.   Plan:  - Continue with med as prescribed.  - Continue her prudent nutritional plan that is low in simple carbohydrates, saturated fats and trans fats to goal of 5-10% weight loss to achieve significant health benefits.  Pt encouraged to continually advance exercise and cardiovascular fitness as tolerated throughout weight loss journey.   TREATMENT PLAN FOR OBESITY: BMI 39.0-39.9,adult-current bmi 38.77 Morbid obesity (HCC)-start bmi 39.7/date 09/23/22 Assessment: Condition is stable. Biometric data collected today, was reviewed with patient.  Fat mass has increased by 2.6 lb. Muscle mass has decreased by 3.2 lb.  Total body water has increased by 1.4 lb.   Plan:  [name] is currently in the action stage of change. As such, her goal is to continue weight management plan. Saul will work on healthier eating habits and continue with following a lower carbohydrate, vegetable and lean protein rich diet plan.  Behavioral Intervention Additional resources provided today: patient declined Evidence-based interventions for health behavior change were utilized today including the discussion of self monitoring techniques, problem-solving barriers and SMART goal setting techniques.   Regarding patient's less desirable eating habits and patterns, we employed the technique of small changes.  Pt will specifically work on: continue prudent nutritional plan and current exercise regiment for next visit.    Recommended Physical Activity Goals [name] has been advised to work up to 150 minutes of moderate intensity aerobic activity a week and strengthening exercises 2-3 times per week for cardiovascular health, weight loss maintenance and preservation of muscle mass.  She has agreed to Continue current level of physical activity   FOLLOW UP: Return in about 2 weeks (around 11/05/2022). She was informed of the importance of frequent follow up visits to maximize her success with intensive lifestyle modifications for her multiple health conditions.  Subjective:   Chief complaint: Obesity Phylliss is here to discuss her progress with her obesity treatment plan. She is on the following a lower carbohydrate, vegetable and lean protein rich diet plan and states she is following her eating plan approximately 70% of the time. She states she is dancing and doing cardio 30 minutes 3 days per week.  Interval History:  [name] is here for a follow up office visit.  Since last office visit: - She mostly followed the meal plan, but on a few occasions ate higher caloric foods (burger, cheese stake, and wings) when she was in [place].  -  She endorses that emotional eating has not been an issue as of late.    Review of Systems:  Pertinent positives were addressed with patient today.   Weight Summary and Biometrics   Weight Lost Since Last Visit: 0  Weight Gained Since Last Visit: 0    Vitals Temp: 97.7 F (36.5 C) BP: 123/74 Pulse Rate: 85 SpO2: 92 %   Anthropometric Measurements Height:  (1.651 m) Weight: 233 lb (105.7 kg) BMI (Calculated): 38.77 Weight at Last Visit: 233 lb Weight Lost Since Last Visit: 0 Weight Gained Since Last Visit: 0 Starting Weight: 238.8 lb Total Weight Loss (lbs): 5 lb (2.268 kg) Peak Weight: 243 lb   Body Composition  Body Fat %: 46.7 % Fat Mass (lbs): 108.8 lbs Muscle Mass (lbs): 118 lbs Total Body Water (lbs): 85.6 lbs Visceral Fat Rating : 12   Other Clinical Data Fasting: No Labs: No Today's Visit #: 3 Starting Date: 09/23/22     Objective:   PHYSICAL EXAM: Blood pressure 123/74, pulse 85,  temperature 97.7 F (36.5 C), height 5\' 5"  (1.651 m), weight 233 lb (105.7 kg), SpO2 92 %. Body mass index is 38.77 kg/m.  General: Well Developed, well nourished, and in no acute distress.  HEENT: Normocephalic, atraumatic Skin: Warm and dry, cap RF less 2 sec, good turgor Chest:  Normal excursion, shape, no gross abn Respiratory: speaking in full sentences, no conversational dyspnea NeuroM-Sk: Ambulates w/o assistance, moves * 4 Psych: A and O *3, insight good, mood-full  DIAGNOSTIC DATA REVIEWED:  BMET    Component Value Date/Time   NA 140 10/07/2022 1252   K 4.9 10/07/2022 1252   CL 101 10/07/2022 1252   CO2 20 10/07/2022 1252   GLUCOSE 87 10/07/2022 1252   BUN 16 10/07/2022 1252   CREATININE 0.78 10/07/2022 1252   CALCIUM 9.6 10/07/2022 1252   Lab Results  Component Value Date   HGBA1C 6.1 (H) 10/07/2022   Lab Results  Component Value Date   INSULIN 18.8 09/23/2022   Lab Results  Component Value Date   TSH 0.938 10/07/2022   CBC     Component Value Date/Time   WBC 12.3 (H) 10/07/2022 1252   RBC 4.55 10/07/2022 1252   HGB 13.4 10/07/2022 1252   HCT 41.0 10/07/2022 1252   PLT 699 (H) 10/07/2022 1252   MCV 90 10/07/2022 1252   MCH 29.5 10/07/2022 1252   MCHC 32.7 10/07/2022 1252   RDW 13.1 10/07/2022 1252   Iron Studies No results found for: "IRON", "TIBC", "FERRITIN", "IRONPCTSAT" Lipid Panel     Component Value Date/Time   CHOL 216 (H) 09/23/2022 0859   TRIG 103 09/23/2022 0859   HDL 60 09/23/2022 0859   LDLCALC 138 (H) 09/23/2022 0859   Hepatic Function Panel     Component Value Date/Time   PROT 6.8 10/07/2022 1252   ALBUMIN 4.2 10/07/2022 1252   AST 18 10/07/2022 1252   ALT 21 10/07/2022 1252   ALKPHOS 121 10/07/2022 1252   BILITOT <0.2 10/07/2022 1252      Component Value Date/Time   TSH 0.938 10/07/2022 1252   Nutritional Lab Results  Component Value Date   VD25OH 14.7 (L) 09/23/2022    Attestations:   Reviewed by clinician on day of visit: allergies, medications, problem list, medical history, surgical history, family history, social history, and previous encounter notes.   I,Special Puri,acting as a Neurosurgeon for [employer], DO.,have documented all relevant documentation on the behalf of Thomasene Lot, DO,as directed by  Thomasene Lot, DO while in the presence of Thomasene Lot, DO.   I, Thomasene Lot, DO, have reviewed all documentation for this visit. The documentation on 10/22/22 for the exam, diagnosis, procedures, and orders are all accurate and complete.

## 2022-11-04 ENCOUNTER — Encounter (INDEPENDENT_AMBULATORY_CARE_PROVIDER_SITE_OTHER): Payer: Self-pay | Admitting: Family Medicine

## 2022-11-04 ENCOUNTER — Ambulatory Visit (INDEPENDENT_AMBULATORY_CARE_PROVIDER_SITE_OTHER): Payer: Managed Care, Other (non HMO) | Admitting: Family Medicine

## 2022-11-04 VITALS — BP 109/69 | HR 87 | Temp 98.3°F | Ht 65.0 in | Wt 231.2 lb

## 2022-11-04 DIAGNOSIS — Z6838 Body mass index (BMI) 38.0-38.9, adult: Secondary | ICD-10-CM

## 2022-11-04 DIAGNOSIS — R7303 Prediabetes: Secondary | ICD-10-CM | POA: Diagnosis not present

## 2022-11-04 DIAGNOSIS — Z6839 Body mass index (BMI) 39.0-39.9, adult: Secondary | ICD-10-CM

## 2022-11-04 DIAGNOSIS — E559 Vitamin D deficiency, unspecified: Secondary | ICD-10-CM | POA: Diagnosis not present

## 2022-11-04 NOTE — Progress Notes (Signed)
Leslie Holt, D.O.  ABFM, ABOM Specializing in Clinical Bariatric Medicine  Office located at: [address] [place], [place]  [address]     Assessment and Plan:   Prediabetes Assessment: Condition is not optimized. Lab Results  Component Value Date   HGBA1C 6.1 (H) 10/07/2022   INSULIN 18.8 09/23/2022  - She is not on any medication for this condition. This is diet controlled - Her hunger and cravings are mostly controlled. When she does having hunger/cravings she eats foods like cucumbers and almonds.   Plan: - Continue her prudent nutritional plan that is low in simple carbohydrates, saturated fats and trans fats to goal of 5-10% weight loss to achieve significant health benefits.  Pt encouraged to continually advance exercise and cardiovascular fitness as tolerated throughout weight loss journey.     Vitamin D deficiency Assessment: Condition is not at goal. Lab Results  Component Value Date   VD25OH 14.7 (L) 09/23/2022  - No issues with Ergocalciferol 50K IU weekly. Denies any side effects.   Plan: - Continue with med as prescribed. Pt denies need for Ergocalciferol refill.  - weight loss will likely improve availability of vitamin D, thus encouraged Leslie Holt to continue with meal plan and their weight loss efforts to further improve this condition.     TREATMENT PLAN FOR OBESITY: BMI 39.0-39.9,adult-current bmi 38.47 Morbid obesity (HCC)-start bmi 39.7/date 09/23/22 Assessment: Condition is improving, but not optimized. Biometric data collected today, was reviewed with patient.  Fat mass has decreased by 3 lb. Muscle mass has increased by 1 lb. Total body water has decreased by 0.2 lb.   Plan:  Leslie Holt is currently in the action stage of change. As such, her goal is to continue weight management plan. Leslie Holt will work on healthier eating habits and continue with following a lower carbohydrate, vegetable and lean protein rich diet plan  Behavioral  Intervention Additional resources provided today: patient declined Evidence-based interventions for health behavior change were utilized today including the discussion of self monitoring techniques, problem-solving barriers and SMART goal setting techniques.   Regarding patient's less desirable eating habits and patterns, we employed the technique of small changes.  Pt will specifically work on: continue with meal plan and  increase exercise for stress management for next visit.    Recommended Physical Activity Goals Leslie Holt has been advised to work up to 150 minutes of moderate intensity aerobic activity a week and strengthening exercises 2-3 times per week for cardiovascular health, weight loss maintenance and preservation of muscle mass.  She has agreed to Think about ways to increase physical activity  FOLLOW UP: Return in about 2 weeks (around 11/18/2022). She was informed of the importance of frequent follow up visits to maximize her success with intensive lifestyle modifications for her multiple health conditions.   Subjective:   Chief complaint: Obesity Leslie Holt is here to discuss her progress with her obesity treatment plan. She is on the following a lower carbohydrate, vegetable and lean protein rich diet plan and states she is following her eating plan approximately 85% of the time. She states she is exercising (dance and cardio) 30 minutes 3 days per week.  Interval History:  Leslie Holt is here for a follow up office visit.  Since last office visit: - She made healthy choices while eating out during [employer].  - Overall, her hunger and cravings are controlled.  - Foods she ate when she had hunger and cravings: cucumbers and almonds. - Endorses she has  been stressed recently due to family issues.   Review of Systems:  Pertinent positives were addressed with patient today.  Weight Summary and Biometrics   Weight Lost Since Last Visit: 2 lb  No data recorded    Vitals Temp: 98.3 F (36.8 C) BP: 109/69 Pulse Rate: 87 SpO2: 96 %   Anthropometric Measurements Height: 5\' 5"  (1.651 m) Weight: 231 lb 3.2 oz (104.9 kg) BMI (Calculated): 38.47 Weight at Last Visit: 233 lb Weight Lost Since Last Visit: 2 lb Starting Weight: 0 Total Weight Loss (lbs): 7 lb (3.175 kg) Peak Weight: 243 lb   Body Composition  Body Fat %: 45.8 % Fat Mass (lbs): 105.8 lbs Muscle Mass (lbs): 119 lbs Total Body Water (lbs): 85.4 lbs Visceral Fat Rating : 12   Other Clinical Data Fasting: No Labs: No Today's Visit #: 4 Starting Date: 09/23/22    Objective:   PHYSICAL EXAM: Blood pressure 109/69, pulse 87, temperature 98.3 F (36.8 C), height 5\' 5"  (1.651 m), weight 231 lb 3.2 oz (104.9 kg), SpO2 96 %. Body mass index is 38.47 kg/m.  General: Well Developed, well nourished, and in no acute distress.  HEENT: Normocephalic, atraumatic Skin: Warm and dry, cap RF less 2 sec, good turgor Chest:  Normal excursion, shape, no gross abn Respiratory: speaking in full sentences, no conversational dyspnea NeuroM-Sk: Ambulates w/o assistance, moves * 4 Psych: A and O *3, insight good, mood-full  DIAGNOSTIC DATA REVIEWED:  BMET    Component Value Date/Time   NA 140 10/07/2022 1252   K 4.9 10/07/2022 1252   CL 101 10/07/2022 1252   CO2 20 10/07/2022 1252   GLUCOSE 87 10/07/2022 1252   BUN 16 10/07/2022 1252   CREATININE 0.78 10/07/2022 1252   CALCIUM 9.6 10/07/2022 1252   Lab Results  Component Value Date   HGBA1C 6.1 (H) 10/07/2022   Lab Results  Component Value Date   INSULIN 18.8 09/23/2022   Lab Results  Component Value Date   TSH 0.938 10/07/2022   CBC    Component Value Date/Time   WBC 12.3 (H) 10/07/2022 1252   RBC 4.55 10/07/2022 1252   HGB 13.4 10/07/2022 1252   HCT 41.0 10/07/2022 1252   PLT 699 (H) 10/07/2022 1252   MCV 90 10/07/2022 1252   MCH 29.5 10/07/2022 1252   MCHC 32.7 10/07/2022 1252   RDW 13.1 10/07/2022 1252    Iron Studies No results found for: "IRON", "TIBC", "FERRITIN", "IRONPCTSAT" Lipid Panel     Component Value Date/Time   CHOL 216 (H) 09/23/2022 0859   TRIG 103 09/23/2022 0859   HDL 60 09/23/2022 0859   LDLCALC 138 (H) 09/23/2022 0859   Hepatic Function Panel     Component Value Date/Time   PROT 6.8 10/07/2022 1252   ALBUMIN 4.2 10/07/2022 1252   AST 18 10/07/2022 1252   ALT 21 10/07/2022 1252   ALKPHOS 121 10/07/2022 1252   BILITOT <0.2 10/07/2022 1252      Component Value Date/Time   TSH 0.938 10/07/2022 1252   Nutritional Lab Results  Component Value Date   VD25OH 14.7 (L) 09/23/2022    Attestations:   Reviewed by clinician on day of visit: allergies, medications, problem list, medical history, surgical history, family history, social history, and previous encounter notes.  I,Special Puri,acting as a scribe for [employer], DO.,have documented all relevant documentation on the behalf of Thomasene Lot, DO,as directed by  Thomasene Lot, DO while in the presence of [employer], DO.  Andrena Mews, DO, have reviewed all documentation for this visit. The documentation on 11/04/22 for the exam, diagnosis, procedures, and orders are all accurate and complete.

## 2022-11-19 ENCOUNTER — Ambulatory Visit (INDEPENDENT_AMBULATORY_CARE_PROVIDER_SITE_OTHER): Payer: Managed Care, Other (non HMO) | Admitting: Family Medicine

## 2022-11-19 ENCOUNTER — Encounter (INDEPENDENT_AMBULATORY_CARE_PROVIDER_SITE_OTHER): Payer: Self-pay | Admitting: Family Medicine

## 2022-11-19 VITALS — BP 103/64 | HR 77 | Temp 98.4°F | Ht 65.0 in | Wt 232.0 lb

## 2022-11-19 DIAGNOSIS — Z6838 Body mass index (BMI) 38.0-38.9, adult: Secondary | ICD-10-CM

## 2022-11-19 DIAGNOSIS — R7303 Prediabetes: Secondary | ICD-10-CM

## 2022-11-19 DIAGNOSIS — E559 Vitamin D deficiency, unspecified: Secondary | ICD-10-CM

## 2022-11-19 DIAGNOSIS — Z6839 Body mass index (BMI) 39.0-39.9, adult: Secondary | ICD-10-CM

## 2022-11-19 MED ORDER — VITAMIN D (ERGOCALCIFEROL) 1.25 MG (50000 UNIT) PO CAPS
50000.0000 [IU] | ORAL_CAPSULE | ORAL | 0 refills | Status: DC
Start: 2022-11-19 — End: 2023-01-06

## 2022-11-19 NOTE — Progress Notes (Signed)
Leslie Holt, D.O.  ABFM, ABOM Specializing in Clinical Bariatric Medicine  Office located at: [address] [place], [place]  [address]     Assessment and Plan:   Medications Discontinued During This Encounter  Medication Reason   Vitamin D, Ergocalciferol, (DRISDOL) 1.25 MG (50000 UNIT) CAPS capsule Reorder     Meds ordered this encounter  Medications   Vitamin D, Ergocalciferol, (DRISDOL) 1.25 MG (50000 UNIT) CAPS capsule    Sig: Take 1 capsule (50,000 Units total) by mouth every 7 (seven) days.    Dispense:  4 capsule    Refill:  0      Vitamin D deficiency Assessment: Condition is not optimized. Lab Results  Component Value Date   VD25OH 14.7 (L) 09/23/2022  - She has been compliant and Ergocalciferol 50K IU weekly. Denies any adverse effects.  Plan: - Continue with OTC supplement. Will refill this today.   - Will continue to monitor levels regularly (every 3-4 mo on average) to keep levels within normal limits and prevent over supplementation.    Prediabetes Assessment: Condition is not optimized. Lab Results  Component Value Date   HGBA1C 6.1 (H) 10/07/2022   INSULIN 18.8 09/23/2022  - Pt is not on any prediabetic medication. This is diet controlled.  - Endorses she mostly does not have any hunger or cravings.   Plan:  - Continue to decrease simple carbs/ sugars; increase fiber and proteins -> follow her meal plan.   Leslie Holt will continue to work on weight loss, exercise, via their meal plan we devised to help decrease the risk of progressing to diabetes.    TREATMENT PLAN FOR OBESITY: BMI 39.0-39.9,adult-current bmi 38.61 Morbid obesity (HCC)-start bmi 39.7/date 09/23/22 Assessment:  Leslie Holt is here to discuss her progress with her obesity treatment plan along with follow-up of her obesity related diagnoses. See Medical Weight Management Flowsheet for complete bioelectrical impedance results.  Condition is not optimized. Biometric data  collected today, was reviewed with patient.   Since last office visit on 11/04/22 patient's Fat mass has increased by 2 lb. Muscle mass has decreased by 0.8 lb. Total body water has increased by 1.4 lb.  Counseling done on how various foods will affect these numbers and how to maximize success  Total lbs lost to date: 6  Total weight loss percentage to date: -2.52   Plan:  -  Begin journaling 1450-1550  calories and 120+ protein daily with Category 3 meal plan as a guide.    - Unless pre-existing renal or cardiopulmonary conditions exist which patient was told to limit their fluid intake by another provider, I recommended roughly one half of their weight in pounds, to be the approximate ounces of non-caloric, non-caffeinated beverages they should drink per day; including more if they are engaging in exercise.   - Showed the patient the Healthy Plate.   - Encouraged patient to journal for increased mindfulness and awareness.   Behavioral Intervention Additional resources provided today:  food journaling log , Category 3 meal plan.  Evidence-based interventions for health behavior change were utilized today including the discussion of self monitoring techniques, problem-solving barriers and SMART goal setting techniques.   Regarding patient's less desirable eating habits and patterns, we employed the technique of small changes.  Pt will specifically work on: beginning journaling and bring food log for next visit.    Recommended Physical Activity Goals  Leslie Holt has been advised to slowly work up to 150 minutes of moderate intensity  aerobic activity a week and strengthening exercises 2-3 times per week for cardiovascular health, weight loss maintenance and preservation of muscle mass.   She has agreed to Continue current level of physical activity   FOLLOW UP: Return in about 2 weeks (around 12/03/2022). She was informed of the importance of frequent follow up visits to maximize her success with  intensive lifestyle modifications for her multiple health conditions.   Subjective:   Chief complaint: Obesity Leslie Holt is here to discuss her progress with her obesity treatment plan. She is on the following a lower carbohydrate, vegetable and lean protein rich diet plan and states she is following her eating plan approximately 90% of the time. She states she is doing dance and cardio 30 minutes 3 days per week.  Interval History:  Leslie Holt is here for a follow up office visit.     Since last office visit:   - Endorses she has been following her meal closely. - Drinking 90-100 ounces of water daily.  - Reports eating 6-8 ounces of lean protein at night.  - She has still been very stressed due to family related issues. Denies doing any emotional eating.   Review of Systems:  Pertinent positives were addressed with patient today.  Weight Summary and Biometrics   Weight Lost Since Last Visit: 0  Weight Gained Since Last Visit: 1lb    Vitals Temp: 98.4 F (36.9 C) BP: 103/64 Pulse Rate: 77 SpO2: 98 %   Anthropometric Measurements Height: 5\' 5"  (1.651 m) Weight: 232 lb (105.2 kg) BMI (Calculated): 38.61 Weight at Last Visit: 231lb Weight Lost Since Last Visit: 0 Weight Gained Since Last Visit: 1lb Starting Weight: 238lb Total Weight Loss (lbs): 6 lb (2.722 kg) Peak Weight: 243lb   Body Composition  Body Fat %: 46.4 % Fat Mass (lbs): 107.8 lbs Muscle Mass (lbs): 118.2 lbs Total Body Water (lbs): 86.8 lbs Visceral Fat Rating : 12   Other Clinical Data Fasting: No Labs: no Today's Visit #: 5 Starting Date: 09/23/22   Objective:   PHYSICAL EXAM: Blood pressure 103/64, pulse 77, temperature 98.4 F (36.9 C), height 5\' 5"  (1.651 m), weight 232 lb (105.2 kg), SpO2 98 %. Body mass index is 38.61 kg/m.  General: Well Developed, well nourished, and in no acute distress.  HEENT: Normocephalic, atraumatic Skin: Warm and dry, cap RF less 2 sec, good  turgor Chest:  Normal excursion, shape, no gross abn Respiratory: speaking in full sentences, no conversational dyspnea NeuroM-Sk: Ambulates w/o assistance, moves * 4 Psych: A and O *3, insight good, mood-full  DIAGNOSTIC DATA REVIEWED:  BMET    Component Value Date/Time   NA 140 10/07/2022 1252   K 4.9 10/07/2022 1252   CL 101 10/07/2022 1252   CO2 20 10/07/2022 1252   GLUCOSE 87 10/07/2022 1252   BUN 16 10/07/2022 1252   CREATININE 0.78 10/07/2022 1252   CALCIUM 9.6 10/07/2022 1252   Lab Results  Component Value Date   HGBA1C 6.1 (H) 10/07/2022   Lab Results  Component Value Date   INSULIN 18.8 09/23/2022   Lab Results  Component Value Date   TSH 0.938 10/07/2022   CBC    Component Value Date/Time   WBC 12.3 (H) 10/07/2022 1252   RBC 4.55 10/07/2022 1252   HGB 13.4 10/07/2022 1252   HCT 41.0 10/07/2022 1252   PLT 699 (H) 10/07/2022 1252   MCV 90 10/07/2022 1252   MCH 29.5 10/07/2022 1252   MCHC 32.7 10/07/2022 1252  RDW 13.1 10/07/2022 1252   Iron Studies No results found for: "IRON", "TIBC", "FERRITIN", "IRONPCTSAT" Lipid Panel     Component Value Date/Time   CHOL 216 (H) 09/23/2022 0859   TRIG 103 09/23/2022 0859   HDL 60 09/23/2022 0859   LDLCALC 138 (H) 09/23/2022 0859   Hepatic Function Panel     Component Value Date/Time   PROT 6.8 10/07/2022 1252   ALBUMIN 4.2 10/07/2022 1252   AST 18 10/07/2022 1252   ALT 21 10/07/2022 1252   ALKPHOS 121 10/07/2022 1252   BILITOT <0.2 10/07/2022 1252      Component Value Date/Time   TSH 0.938 10/07/2022 1252   Nutritional Lab Results  Component Value Date   VD25OH 14.7 (L) 09/23/2022    Attestations:   Reviewed by clinician on day of visit: allergies, medications, problem list, medical history, surgical history, family history, social history, and previous encounter notes.   I,Special Puri,acting as a Neurosurgeon for [employer], DO.,have documented all relevant documentation on the behalf of  Thomasene Lot, DO,as directed by  Thomasene Lot, DO while in the presence of Thomasene Lot, DO.   I, Thomasene Lot, DO, have reviewed all documentation for this visit. The documentation on 11/19/22 for the exam, diagnosis, procedures, and orders are all accurate and complete.

## 2022-12-03 ENCOUNTER — Ambulatory Visit (INDEPENDENT_AMBULATORY_CARE_PROVIDER_SITE_OTHER): Payer: Managed Care, Other (non HMO) | Admitting: Family Medicine

## 2022-12-03 ENCOUNTER — Encounter (INDEPENDENT_AMBULATORY_CARE_PROVIDER_SITE_OTHER): Payer: Self-pay | Admitting: Family Medicine

## 2022-12-03 VITALS — BP 122/80 | HR 81 | Temp 97.8°F | Ht 65.0 in | Wt 229.0 lb

## 2022-12-03 DIAGNOSIS — E559 Vitamin D deficiency, unspecified: Secondary | ICD-10-CM

## 2022-12-03 DIAGNOSIS — R7303 Prediabetes: Secondary | ICD-10-CM

## 2022-12-03 DIAGNOSIS — Z6839 Body mass index (BMI) 39.0-39.9, adult: Secondary | ICD-10-CM

## 2022-12-03 DIAGNOSIS — Z6838 Body mass index (BMI) 38.0-38.9, adult: Secondary | ICD-10-CM

## 2022-12-03 DIAGNOSIS — L2389 Allergic contact dermatitis due to other agents: Secondary | ICD-10-CM | POA: Diagnosis not present

## 2022-12-03 NOTE — Progress Notes (Signed)
Leslie Holt, D.O.  ABFM, ABOM Specializing in Clinical Bariatric Medicine  Office located at: [address] [place], [place]  [address]     Assessment and Plan:    Prediabetes Assessment: Condition is diet controlled and not optimized. Her hunger and cravings are controlled and she denies having any issues with hunger and cravings.   Lab Results  Component Value Date   HGBA1C 6.1 (H) 10/07/2022   INSULIN 18.8 09/23/2022   Plan: Leslie Holt will continue to work on weight loss, exercise, via their meal plan we devised to help decrease the risk of progressing to diabetes. We will recheck A1c and fasting insulin level in approximately 3 months from last check, or as deemed appropriate.     Vitamin D deficiency Assessment: Condition is not optimized. She has been compliant with Ergocalciferol 50K IU weekly. Denies any adverse effects.  Lab Results  Component Value Date   VD25OH 14.7 (L) 09/23/2022   Plan: Continue with OTC supplement. Pt denies need for refill.Will continue  to monitor levels regularly (every 3-4 mo on average) to keep levels within normal limits and prevent over supplementation.   Allergic contact dermatitis due to other agents Assessment: Condition is not optimized. Patient is positive for Erythemtous papules and macules on right upper extremity from ulnar aspect of hand to proximal shoulder. She endorses that these symptoms started yesterday and thinks that she contracted them from her dog.   Plan: Continue with Allegra. I recommended pt to apply either a OTC 1% Hydrocortisone cream or Pepcid to her affected extremity as needed. I also encouraged her to take oat meal baths for itchiness relief. If symptoms don't improve, pt advised to follow up with PCP.    TREATMENT PLAN FOR OBESITY: BMI 39.0-39.9,adult-current bmi 38.11 Morbid obesity (HCC)-start bmi 39.7/date 09/23/22 Assessment:  Leslie Holt is here to discuss her progress with her obesity  treatment plan along with follow-up of her obesity related diagnoses. See Medical Weight Management Flowsheet for complete bioelectrical impedance results.  Condition is improving Biometric data collected today, was reviewed with patient.   Since last office visit on 11/19/22 patient's  Muscle mass has increased by 0.2 lb. Fat mass has decreased by 3.2 lb. Total body water has decreased by 2.4 lb.  Counseling done on how various foods will affect these numbers and how to maximize success  Total lbs lost to date: 9  Total weight loss percentage to date: 4.18   Plan:  - Begin journaling 1500-1600 calories and 120+ protein daily with Category 3 meal plan as a guide.   Behavioral Intervention Additional resources provided today:  food journaling log Evidence-based interventions for health behavior change were utilized today including the discussion of self monitoring techniques, problem-solving barriers and SMART goal setting techniques.   Regarding patient's less desirable eating habits and patterns, we employed the technique of small changes.  Pt will specifically work on: begin with journaling intake and bringing food log for next visit.    Recommended Physical Activity Goals  Leslie Holt has been advised to slowly work up to 150 minutes of moderate intensity aerobic activity a week and strengthening exercises 2-3 times per week for cardiovascular health, weight loss maintenance and preservation of muscle mass.   She has agreed to Continue current level of physical activity   FOLLOW UP: Return in about 2 weeks (around 12/17/2022). She was informed of the importance of frequent follow up visits to maximize her success with intensive lifestyle modifications for her multiple  health conditions.   Subjective:   Chief complaint: Obesity Leslie Holt is here to discuss her progress with her obesity treatment plan. She is on the the Category 3 Plan and keeping a food journal and adhering to recommended goals  of 1500-1600 calories and 120+ protein and states she is following her eating plan approximately 40% of the time. She states she is doing cardio 30 minutes 3 days per week.  Interval History:  Leslie Holt is here for a follow up office visit.     Since last office visit:    - Leslie Holt was sick for a couple of days and during this period period she did not exercise and had foods such as, chicken noodle soup with broth. She also did not journal. She has recovered. No concerns.  - No issues with hunger and cravings.  Pharmacotherapy Current Anti-obesity medications: None. Reported side effects: None.  Review of Systems:  Pertinent positives were addressed with patient today.  Weight Summary and Biometrics   Weight Lost Since Last Visit: 3  No data recorded   Vitals Temp: 97.8 F (36.6 C) BP: 122/80 Pulse Rate: 81 SpO2: 98 %   Anthropometric Measurements Height: 5\' 5"  (1.651 m) Weight: 229 lb (103.9 kg) BMI (Calculated): 38.11 Weight at Last Visit: 232lb Weight Lost Since Last Visit: 3 Starting Weight: 238lb Total Weight Loss (lbs): 9 lb (4.082 kg) Peak Weight: 243lb   Body Composition  Body Fat %: 45.6 % Fat Mass (lbs): 104.6 lbs Muscle Mass (lbs): 118.4 lbs Total Body Water (lbs): 84.4 lbs Visceral Fat Rating : 12   Other Clinical Data Fasting: no Labs: no Today's Visit #: 6 Starting Date: 09/23/22     Objective:   PHYSICAL EXAM: Blood pressure 122/80, pulse 81, temperature 97.8 F (36.6 C), height 5\' 5"  (1.651 m), weight 229 lb (103.9 kg), last menstrual period 11/29/2022, SpO2 98 %. Body mass index is 38.11 kg/m.  General: Well Developed, well nourished, and in no acute distress.  HEENT: Normocephalic, atraumatic Skin: Warm and dry, cap RF less 2 sec, good turgor, Erythemtous papules and macules on right upper extremity from ulnar aspect of hand to proximal shoulder.  Chest:  Normal excursion, shape, no gross abn Respiratory: speaking in full  sentences, no conversational dyspnea NeuroM-Sk: Ambulates w/o assistance, moves * 4 Psych: A and O *3, insight good, mood-full  DIAGNOSTIC DATA REVIEWED:  BMET    Component Value Date/Time   NA 140 10/07/2022 1252   K 4.9 10/07/2022 1252   CL 101 10/07/2022 1252   CO2 20 10/07/2022 1252   GLUCOSE 87 10/07/2022 1252   BUN 16 10/07/2022 1252   CREATININE 0.78 10/07/2022 1252   CALCIUM 9.6 10/07/2022 1252   Lab Results  Component Value Date   HGBA1C 6.1 (H) 10/07/2022   Lab Results  Component Value Date   INSULIN 18.8 09/23/2022   Lab Results  Component Value Date   TSH 0.938 10/07/2022   CBC    Component Value Date/Time   WBC 12.3 (H) 10/07/2022 1252   RBC 4.55 10/07/2022 1252   HGB 13.4 10/07/2022 1252   HCT 41.0 10/07/2022 1252   PLT 699 (H) 10/07/2022 1252   MCV 90 10/07/2022 1252   MCH 29.5 10/07/2022 1252   MCHC 32.7 10/07/2022 1252   RDW 13.1 10/07/2022 1252   Iron Studies No results found for: "IRON", "TIBC", "FERRITIN", "IRONPCTSAT" Lipid Panel     Component Value Date/Time   CHOL 216 (H) 09/23/2022 0859   TRIG  103 09/23/2022 0859   HDL 60 09/23/2022 0859   LDLCALC 138 (H) 09/23/2022 0859   Hepatic Function Panel     Component Value Date/Time   PROT 6.8 10/07/2022 1252   ALBUMIN 4.2 10/07/2022 1252   AST 18 10/07/2022 1252   ALT 21 10/07/2022 1252   ALKPHOS 121 10/07/2022 1252   BILITOT <0.2 10/07/2022 1252      Component Value Date/Time   TSH 0.938 10/07/2022 1252   Nutritional Lab Results  Component Value Date   VD25OH 14.7 (L) 09/23/2022    Attestations:   Reviewed by clinician on day of visit: allergies, medications, problem list, medical history, surgical history, family history, social history, and previous encounter notes.  I,Special Puri,acting as a Neurosurgeon for [employer], DO.,have documented all relevant documentation on the behalf of Leslie Lot, DO,as directed by  Leslie Lot, DO while in the presence of Leslie Lot, DO.   I, Leslie Lot, DO, have reviewed all documentation for this visit. The documentation on 12/03/22 for the exam, diagnosis, procedures, and orders are all accurate and complete.

## 2022-12-17 ENCOUNTER — Ambulatory Visit (INDEPENDENT_AMBULATORY_CARE_PROVIDER_SITE_OTHER): Payer: Managed Care, Other (non HMO) | Admitting: Family Medicine

## 2022-12-17 ENCOUNTER — Encounter (INDEPENDENT_AMBULATORY_CARE_PROVIDER_SITE_OTHER): Payer: Self-pay

## 2022-12-21 ENCOUNTER — Other Ambulatory Visit (INDEPENDENT_AMBULATORY_CARE_PROVIDER_SITE_OTHER): Payer: Self-pay | Admitting: Family Medicine

## 2022-12-21 DIAGNOSIS — E559 Vitamin D deficiency, unspecified: Secondary | ICD-10-CM

## 2023-01-06 ENCOUNTER — Ambulatory Visit (INDEPENDENT_AMBULATORY_CARE_PROVIDER_SITE_OTHER): Payer: Managed Care, Other (non HMO) | Admitting: Family Medicine

## 2023-01-06 ENCOUNTER — Encounter (INDEPENDENT_AMBULATORY_CARE_PROVIDER_SITE_OTHER): Payer: Self-pay | Admitting: Family Medicine

## 2023-01-06 VITALS — BP 111/76 | HR 93 | Temp 98.2°F | Ht 65.0 in | Wt 226.0 lb

## 2023-01-06 DIAGNOSIS — Z6837 Body mass index (BMI) 37.0-37.9, adult: Secondary | ICD-10-CM | POA: Diagnosis not present

## 2023-01-06 DIAGNOSIS — E559 Vitamin D deficiency, unspecified: Secondary | ICD-10-CM

## 2023-01-06 DIAGNOSIS — R7303 Prediabetes: Secondary | ICD-10-CM | POA: Diagnosis not present

## 2023-01-06 MED ORDER — VITAMIN D (ERGOCALCIFEROL) 1.25 MG (50000 UNIT) PO CAPS
50000.0000 [IU] | ORAL_CAPSULE | ORAL | 0 refills | Status: DC
Start: 2023-01-06 — End: 2023-07-16

## 2023-01-06 NOTE — Progress Notes (Signed)
Leslie Holt, D.O.  ABFM, ABOM Specializing in Clinical Bariatric Medicine  Office located at: [address] [place], [place]  [address]     Assessment and Plan:   Meds ordered this encounter  Medications   Vitamin D, Ergocalciferol, (DRISDOL) 1.25 MG (50000 UNIT) CAPS capsule    Sig: Take 1 capsule (50,000 Units total) by mouth every 7 (seven) days.    Dispense:  4 capsule    Refill:  0    Next OV check lipid profile with direct LDL, B12, vitamin D, and A1c.   Vitamin D deficiency Assessment: Condition is Not at goal. Her vitamin D level is very low and she continues Ergocalciferol 50K IU weekly.   Lab Results  Component Value Date   VD25OH 14.7 (L) 09/23/2022   Plan: She will continue ERGO as directed. Will refill today.   Weight loss will likely improve availability of vitamin D, thus encouraged Sorina to continue with meal plan and their weight loss efforts to further improve this condition.  Thus, we will need to monitor levels regularly (every 3-4 mo on average) to keep levels within normal limits and prevent over supplementation.   Prediabetes Assessment: Condition is Not optimized. Her A1c and insulin are elevated. Pt states that her hunger and cravings are well controlled.   Lab Results  Component Value Date   HGBA1C 6.1 (H) 10/07/2022   INSULIN 18.8 09/23/2022    Plan: Continue to decrease simple carbs/ sugars; increase fiber and proteins -> follow her meal plan. Anticipatory guidance given. Tracee will continue to work on weight loss, exercise, via their meal plan we devised to help decrease the risk of progressing to diabetes. We will recheck A1c and fasting insulin level next OV.   TREATMENT PLAN FOR OBESITY: BMI 39.0-39.9,adult-current bmi 37.61 Morbid obesity (HCC)-start bmi 39.7/date 09/23/22 Assessment:  Ayasha Ellingsen is here to discuss her progress with her obesity treatment plan along with follow-up of her obesity related diagnoses. See  Medical Weight Management Flowsheet for complete bioelectrical impedance results.  Condition is docourse: improving. Biometric data collected today, was reviewed with patient.   Since last office visit on 12/03/2022 patient's  Muscle mass has been stable. Fat mass has decreased by 3.4lb. Total body water has increased by 0.2lb.  Counseling done on how various foods will affect these numbers and how to maximize success  Total lbs lost to date: 12 lbs Total weight loss percentage to date: 0.72%   Plan:  Continue journaling 1500-1600 calories and 120+ protein daily with Category 3 meal plan as a guide.   Behavioral Intervention Additional resources provided today: patient declined Evidence-based interventions for health behavior change were utilized today including the discussion of self monitoring techniques, problem-solving barriers and SMART goal setting techniques.   Regarding patient's less desirable eating habits and patterns, we employed the technique of small changes.  Pt will specifically work on: increasing her lean protein intake and maintain with current exercise regiment for next visit.    Recommended Physical Activity Goals  Sky has been advised to slowly work up to 150 minutes of moderate intensity aerobic activity a week and strengthening exercises 2-3 times per week for cardiovascular health, weight loss maintenance and preservation of muscle mass.   She has agreed to Continue current level of physical activity   FOLLOW UP: Return in about 15 days (around 01/21/2023). She was informed of the importance of frequent follow up visits to maximize her success with intensive lifestyle modifications for  her multiple health conditions.  Subjective:   Chief complaint: Obesity Laryssa is here to discuss her progress with her obesity treatment plan. She is on the Category 3 Plan and keeping a food journal and adhering to recommended goals of 1500-1600 calories and 120+ protein and  states she is following her eating plan approximately 80% of the time. She states she is exercising (dance cardio/ some strength training) 40 minutes 2 days per week.  Interval History:  Summar Mcglothlin is here for a follow up office visit.  Since last OV, she was on vacation in [place] for about a week and has helped her son move to [employer] for school. She states being able to meet her calorie goals every day, however she is having difficulties obtaining her protein goals consistently. She continues to journal consistently. She believes she is drinking over 100oz of water daily.    Review of Systems:  Pertinent positives were addressed with patient today.  Reviewed by clinician on day of visit: allergies, medications, problem list, medical history, surgical history, family history, social history, and previous encounter notes.  Weight Summary and Biometrics   Weight Lost Since Last Visit: 3lb  Weight Gained Since Last Visit: 0lb   Vitals Temp: 98.2 F (36.8 C) BP: 111/76 Pulse Rate: 93 SpO2: 98 %   Anthropometric Measurements Height: 5\' 5"  (1.651 m) Weight: 226 lb (102.5 kg) BMI (Calculated): 37.61 Weight at Last Visit: 229lb Weight Lost Since Last Visit: 3lb Weight Gained Since Last Visit: 0lb Starting Weight: 238lb Total Weight Loss (lbs): 12 lb (5.443 kg) Peak Weight: 243lb   Body Composition  Body Fat %: 44.8 % Fat Mass (lbs): 101.2 lbs Muscle Mass (lbs): 118.4 lbs Total Body Water (lbs): 84.6 lbs Visceral Fat Rating : 11   Other Clinical Data Fasting: no Labs: no Today's Visit #: 7 Starting Date: 09/23/22   Objective:   PHYSICAL EXAM: Blood pressure 111/76, pulse 93, temperature 98.2 F (36.8 C), height 5\' 5"  (1.651 m), weight 226 lb (102.5 kg), SpO2 98 %. Body mass index is 37.61 kg/m.  General: Well Developed, well nourished, and in no acute distress.  HEENT: Normocephalic, atraumatic Skin: Warm and dry, cap RF less 2 sec, good turgor Chest:   Normal excursion, shape, no gross abn Respiratory: speaking in full sentences, no conversational dyspnea NeuroM-Sk: Ambulates w/o assistance, moves * 4 Psych: A and O *3, insight good, mood-full  DIAGNOSTIC DATA REVIEWED:  BMET    Component Value Date/Time   NA 140 10/07/2022 1252   K 4.9 10/07/2022 1252   CL 101 10/07/2022 1252   CO2 20 10/07/2022 1252   GLUCOSE 87 10/07/2022 1252   BUN 16 10/07/2022 1252   CREATININE 0.78 10/07/2022 1252   CALCIUM 9.6 10/07/2022 1252   Lab Results  Component Value Date   HGBA1C 6.1 (H) 10/07/2022   Lab Results  Component Value Date   INSULIN 18.8 09/23/2022   Lab Results  Component Value Date   TSH 0.938 10/07/2022   CBC    Component Value Date/Time   WBC 12.3 (H) 10/07/2022 1252   RBC 4.55 10/07/2022 1252   HGB 13.4 10/07/2022 1252   HCT 41.0 10/07/2022 1252   PLT 699 (H) 10/07/2022 1252   MCV 90 10/07/2022 1252   MCH 29.5 10/07/2022 1252   MCHC 32.7 10/07/2022 1252   RDW 13.1 10/07/2022 1252   Iron Studies No results found for: "IRON", "TIBC", "FERRITIN", "IRONPCTSAT" Lipid Panel     Component Value Date/Time  CHOL 216 (H) 09/23/2022 0859   TRIG 103 09/23/2022 0859   HDL 60 09/23/2022 0859   LDLCALC 138 (H) 09/23/2022 0859   Hepatic Function Panel     Component Value Date/Time   PROT 6.8 10/07/2022 1252   ALBUMIN 4.2 10/07/2022 1252   AST 18 10/07/2022 1252   ALT 21 10/07/2022 1252   ALKPHOS 121 10/07/2022 1252   BILITOT <0.2 10/07/2022 1252      Component Value Date/Time   TSH 0.938 10/07/2022 1252   Nutritional Lab Results  Component Value Date   VD25OH 14.7 (L) 09/23/2022    Attestations:   I, Special Puri, acting as a Stage manager for Thomasene Lot, DO., have compiled all relevant documentation for today's office visit on behalf of Thomasene Lot, DO, while in the presence of [employer], DO.  I have reviewed the above documentation for accuracy and completeness, and I agree with the  above. Leslie Holt, D.O.  The 21st Century Cures Act was signed into law in 2016 which includes the topic of electronic health records.  This provides immediate access to information in MyChart.  This includes consultation notes, operative notes, office notes, lab results and pathology reports.  If you have any questions about what you read please let us know at your next visit so we can discuss your concerns and take corrective action if need be.  We are right here with you.

## 2023-01-20 ENCOUNTER — Ambulatory Visit (INDEPENDENT_AMBULATORY_CARE_PROVIDER_SITE_OTHER): Payer: Managed Care, Other (non HMO) | Admitting: Family Medicine

## 2023-02-03 ENCOUNTER — Ambulatory Visit (INDEPENDENT_AMBULATORY_CARE_PROVIDER_SITE_OTHER): Payer: Managed Care, Other (non HMO) | Admitting: Family Medicine

## 2023-07-16 ENCOUNTER — Encounter: Payer: Self-pay | Admitting: Emergency Medicine

## 2023-07-16 ENCOUNTER — Ambulatory Visit
Admission: EM | Admit: 2023-07-16 | Discharge: 2023-07-16 | Disposition: A | Discharge status: Home / Self Care | Payer: Managed Care, Other (non HMO) | Attending: Internal Medicine | Admitting: Internal Medicine

## 2023-07-16 DIAGNOSIS — R58 Hemorrhage, not elsewhere classified: Secondary | ICD-10-CM | POA: Insufficient documentation

## 2023-07-16 DIAGNOSIS — R87619 Unspecified abnormal cytological findings in specimens from cervix uteri: Secondary | ICD-10-CM | POA: Insufficient documentation

## 2023-07-16 DIAGNOSIS — K8020 Calculus of gallbladder without cholecystitis without obstruction: Secondary | ICD-10-CM | POA: Insufficient documentation

## 2023-07-16 DIAGNOSIS — Z7189 Other specified counseling: Secondary | ICD-10-CM | POA: Insufficient documentation

## 2023-07-16 DIAGNOSIS — I728 Aneurysm of other specified arteries: Secondary | ICD-10-CM | POA: Insufficient documentation

## 2023-07-16 DIAGNOSIS — J029 Acute pharyngitis, unspecified: Secondary | ICD-10-CM | POA: Insufficient documentation

## 2023-07-16 DIAGNOSIS — Z30431 Encounter for routine checking of intrauterine contraceptive device: Secondary | ICD-10-CM | POA: Insufficient documentation

## 2023-07-16 DIAGNOSIS — R112 Nausea with vomiting, unspecified: Secondary | ICD-10-CM | POA: Insufficient documentation

## 2023-07-16 DIAGNOSIS — K651 Peritoneal abscess: Secondary | ICD-10-CM | POA: Insufficient documentation

## 2023-07-16 DIAGNOSIS — R509 Fever, unspecified: Secondary | ICD-10-CM | POA: Diagnosis present

## 2023-07-16 DIAGNOSIS — Z30430 Encounter for insertion of intrauterine contraceptive device: Secondary | ICD-10-CM | POA: Insufficient documentation

## 2023-07-16 DIAGNOSIS — O210 Mild hyperemesis gravidarum: Secondary | ICD-10-CM | POA: Insufficient documentation

## 2023-07-16 DIAGNOSIS — Z23 Encounter for immunization: Secondary | ICD-10-CM | POA: Insufficient documentation

## 2023-07-16 LAB — POCT RAPID STREP A (OFFICE): Rapid Strep A Screen: NEGATIVE

## 2023-07-16 LAB — POC COVID19/FLU A&B COMBO
Covid Antigen, POC: NEGATIVE
Influenza A Antigen, POC: NEGATIVE
Influenza B Antigen, POC: NEGATIVE

## 2023-07-16 MED ORDER — PENICILLIN V POTASSIUM 500 MG PO TABS: 500.0000 mg | ORAL_TABLET | Freq: Two times a day (BID) | ORAL | 0 refills | Status: AC

## 2023-07-16 MED ORDER — PSEUDOEPHEDRINE HCL ER 120 MG PO TB12: 120.0000 mg | ORAL_TABLET | Freq: Two times a day (BID) | ORAL | 0 refills | Status: DC

## 2023-07-16 NOTE — Discharge Instructions (Signed)
I am ordering a throat culture but I will start you on antibiotics in the mean time in case is other strand of strep.

## 2023-07-16 NOTE — ED Triage Notes (Signed)
Pt reports sore throat, fevers, and nasal congestion x3 days. Max fever: 101.6 yesterday. Relief with tylenol at home. Decreased appetite and fluid intake from pain with swallowing. Pt reports L ear pain that started this morning as well.

## 2023-07-16 NOTE — ED Provider Notes (Signed)
[facility]    CSN: [id number] Arrival date & time: 07/16/23  1232      History   Chief Complaint Chief Complaint  Patient presents with   Sore Throat   Fever    HPI Leslie Holt is a 43 y.o. female who developed a ST 3 days ago with nose congestion, but no cough. Had fever of 101.6 yesterday. Has had decreased appetite and L ear pain since this am. She denies coughing.     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   Abnormal Pap smear of cervix 07/16/2023   Calculus of gallbladder 07/16/2023   Hemorrhage 07/16/2023   Hyperemesis gravidarum 07/16/2023   Nausea with vomiting 07/16/2023   Need for prophylactic vaccination and inoculation against influenza 07/16/2023   Encounter for insertion of intrauterine contraceptive device (IUD) 07/16/2023   Other specified counseling 07/16/2023   Surveillance of previously prescribed intrauterine contraceptive device 07/16/2023   Peritoneal abscess (HCC) 07/16/2023   Insulin resistance - new diagnosis 10/07/2022   Vitamin D deficiency- new diagnosis 10/07/2022   B12 deficiency due to diet-new diagnosis 10/07/2022   Other hyperlipidemia- new diagnosis 10/07/2022   Other fatigue 09/23/2022   SOB (shortness of breath) on exertion 09/23/2022   Morbid obesity (HCC)-start bmi 39.7/date 09/23/22 09/23/2022   BMI 39.0-39.9,adult-current bmi 39.7 09/23/2022   Mood disorder (HCC)- Emotional Eating 09/23/2022   Gastroesophageal reflux disease 09/23/2022   Anxiety 09/23/2022   Attention deficit disorder 09/23/2022   Central adiposity 08/31/2022   Generalized obesity 08/31/2022   Polyphagia 08/31/2022    Past Surgical History:  Procedure Laterality Date   CESAREAN SECTION  2016   pancreas     SPLENECTOMY, TOTAL      TONSILLECTOMY  04/2014   TYMPANOPLASTY  1996   TYMPANOSTOMY TUBE PLACEMENT  1987    OB History   No obstetric history on file.      Home Medications    Prior to Admission medications   Medication Sig Start Date End Date Taking? Authorizing Provider  buPROPion (WELLBUTRIN XL) 300 MG 24 hr tablet Take 300 mg by mouth daily.   Yes [provider]  cholecalciferol (VITAMIN D3) 25 MCG (1000 UNIT) tablet Take 1,000 Units by mouth daily.   Yes [provider]  cyanocobalamin (VITAMIN B12) 500 MCG tablet 300-500 mcg daily 10/07/22  Yes Opalski, Deborah, DO  escitalopram (LEXAPRO) 10 MG tablet Take 10 mg by mouth daily.   Yes [provider]  Norgestimate-Ethinyl Estradiol Triphasic 0.18/0.215/0.25 MG-25 MCG tab Take 1 tablet by mouth daily.   Yes [provider]  Semaglutide, 1 MG/DOSE, 4 MG/3ML SOPN Inject 1 mg into the skin once a week.   Yes [provider]  fexofenadine (ALLEGRA) 180 MG tablet Take 180 mg by mouth daily.    [provider]  omeprazole (PRILOSEC) 10 MG capsule Take 10 mg by mouth daily. Patient not taking: Reported on 07/16/2023    [provider]  Vitamin D, Ergocalciferol, (DRISDOL) 1.25 MG (50000 UNIT) CAPS capsule Take 1 capsule (50,000 Units total) by mouth every 7 (seven) days. Patient not taking: Reported on 07/16/2023 01/06/23   Thomasene Lot, DO    Family History Family History  Problem Relation Age of  Onset   Depression Mother    Anxiety disorder Mother    Obesity Mother    Stroke Mother    High blood pressure Father    Alcoholism Father     Social History Social History   Tobacco Use   Smoking status: Never   Smokeless tobacco: Never  Vaping Use   Vaping status: Never Used  Substance Use Topics   Alcohol use: Yes    Comment: occassional   Drug use: Never     Allergies   Goat milk, Latex, Shellfish allergy, and Sulfa antibiotics   Review of Systems Review of Systems  As  noted in HPI Physical Exam Triage Vital Signs ED Triage Vitals  Encounter Vitals Group     BP 07/16/23 1426 123/85     Systolic BP Percentile --      Diastolic BP Percentile --      Pulse Rate 07/16/23 1426 (!) 104     Resp 07/16/23 1426 18     Temp 07/16/23 1426 98.6 F (37 C)     Temp Source 07/16/23 1426 Oral     SpO2 07/16/23 1426 96 %     Weight --      Height --      Head Circumference --      Peak Flow --      Pain Score 07/16/23 1428 6     Pain Loc --      Pain Education --      Exclude from Growth Chart --    No data found.  Updated Vital Signs BP 123/85 (BP Location: Left Arm)   Pulse (!) 104   Temp 98.6 F (37 C) (Oral)   Resp 18   LMP 06/20/2023 (Approximate)   SpO2 96%   Visual Acuity Right Eye Distance:   Left Eye Distance:   Bilateral Distance:    Right Eye Near:   Left Eye Near:    Bilateral Near:     Physical Exam Physical Exam Vitals signs and nursing note reviewed.  Constitutional:      General: She is not in acute distress.    Appearance: Normal appearance. She is not ill-appearing, toxic-appearing or diaphoretic.  HENT:     Head: Normocephalic.     Right Ear: Tympanic membrane with scarring, ear canal and external ear normal.     Left Ear: Tympanic membrane healthy, ear canal and external ear normal.     Nose: Nose normal.     Mouth/Throat: erythematous without exudate. Makes gestures of pain when she swallows.     Mouth: Mucous membranes are moist.  Eyes:     General: No scleral icterus.       Right eye: No discharge.        Left eye: No discharge.     Conjunctiva/sclera: Conjunctivae normal.  Neck:     Musculoskeletal: Neck supple. No neck rigidity.  Cardiovascular:     Rate and Rhythm: Normal rate and regular rhythm.     Heart sounds: No murmur.  Pulmonary:     Effort: Pulmonary effort is normal.     Breath sounds: Normal breath sounds.  Musculoskeletal: Normal range of motion.  Lymphadenopathy:     Cervical: No cervical  adenopathy.  Skin:    General: Skin is warm and dry.     Coloration: Skin is not jaundiced.     Findings: No rash.  Neurological:     Mental Status: She is alert and oriented to person, place, and time.  Gait: Gait normal.  Psychiatric:        Mood and Affect: Mood normal.        Behavior: Behavior normal.        Thought Content: Thought content normal.        Judgment: Judgment normal.    UC Treatments / Results  Labs (all labs ordered are listed, but only abnormal results are displayed) Labs Reviewed  POCT RAPID STREP A (OFFICE) - Normal  POC COVID19/FLU A&B COMBO - Normal  Rapid strep is negative Flu and Covid is negative  EKG   Radiology No results found.  Procedures Procedures (including critical care time)  Medications Ordered in UC Medications - No data to display  Initial Impression / Assessment and Plan / UC Course  I have reviewed the triage vital signs and the nursing notes.  Pertinent labs & imaging results that were available during my care of the patient were reviewed by me and considered in my medical decision making (see chart for details).  Pharyngitis  I ordered a throat culture and in the mean time I placed her on Penicillin and Sudafed as noted.    Final Clinical Impressions(s) / UC Diagnoses   Final diagnoses:  None   Discharge Instructions   None    ED Prescriptions   None    PDMP not reviewed this encounter.   Garey Ham, PA-C 07/16/23 1725

## 2023-07-19 LAB — CULTURE, GROUP A STREP (THRC)

## 2024-02-03 ENCOUNTER — Ambulatory Visit
Admission: RE | Admit: 2024-02-03 | Discharge: 2024-02-03 | Disposition: A | Discharge status: Home / Self Care | Source: Ambulatory Visit | Attending: Unknown Physician Specialty | Admitting: Unknown Physician Specialty

## 2024-02-03 ENCOUNTER — Other Ambulatory Visit: Payer: Self-pay | Admitting: Unknown Physician Specialty

## 2024-02-03 DIAGNOSIS — R051 Acute cough: Secondary | ICD-10-CM

## 2024-02-15 ENCOUNTER — Encounter: Payer: Self-pay | Admitting: Emergency Medicine

## 2024-02-15 ENCOUNTER — Other Ambulatory Visit: Payer: Self-pay

## 2024-02-15 ENCOUNTER — Emergency Department (HOSPITAL_COMMUNITY)

## 2024-02-15 ENCOUNTER — Observation Stay (HOSPITAL_COMMUNITY)
Admission: EM | Admit: 2024-02-15 | Discharge: 2024-02-17 | Disposition: A | Discharge status: Home / Self Care | Source: Ambulatory Visit | Attending: Internal Medicine | Admitting: Internal Medicine

## 2024-02-15 ENCOUNTER — Ambulatory Visit
Admission: EM | Admit: 2024-02-15 | Discharge: 2024-02-15 | Disposition: A | Discharge status: Other Acute Inpatient Hospital | Source: Home / Self Care

## 2024-02-15 DIAGNOSIS — F419 Anxiety disorder, unspecified: Secondary | ICD-10-CM | POA: Insufficient documentation

## 2024-02-15 DIAGNOSIS — E669 Obesity, unspecified: Secondary | ICD-10-CM | POA: Diagnosis not present

## 2024-02-15 DIAGNOSIS — I820 Budd-Chiari syndrome: Principal | ICD-10-CM

## 2024-02-15 DIAGNOSIS — F900 Attention-deficit hyperactivity disorder, predominantly inattentive type: Secondary | ICD-10-CM | POA: Insufficient documentation

## 2024-02-15 DIAGNOSIS — K219 Gastro-esophageal reflux disease without esophagitis: Secondary | ICD-10-CM | POA: Diagnosis not present

## 2024-02-15 DIAGNOSIS — F1090 Alcohol use, unspecified, uncomplicated: Secondary | ICD-10-CM | POA: Diagnosis not present

## 2024-02-15 DIAGNOSIS — R109 Unspecified abdominal pain: Secondary | ICD-10-CM | POA: Diagnosis present

## 2024-02-15 DIAGNOSIS — R1013 Epigastric pain: Secondary | ICD-10-CM | POA: Insufficient documentation

## 2024-02-15 DIAGNOSIS — Z91040 Latex allergy status: Secondary | ICD-10-CM | POA: Diagnosis not present

## 2024-02-15 DIAGNOSIS — I81 Portal vein thrombosis: Secondary | ICD-10-CM | POA: Diagnosis not present

## 2024-02-15 DIAGNOSIS — F988 Other specified behavioral and emotional disorders with onset usually occurring in childhood and adolescence: Secondary | ICD-10-CM | POA: Diagnosis present

## 2024-02-15 DIAGNOSIS — J45909 Unspecified asthma, uncomplicated: Secondary | ICD-10-CM | POA: Diagnosis not present

## 2024-02-15 DIAGNOSIS — Z6838 Body mass index (BMI) 38.0-38.9, adult: Secondary | ICD-10-CM | POA: Insufficient documentation

## 2024-02-15 LAB — CBC
HCT: 41.9 % (ref 36.0–46.0)
Hemoglobin: 14 g/dL (ref 12.0–15.0)
MCH: 29.7 pg (ref 26.0–34.0)
MCHC: 33.4 g/dL (ref 30.0–36.0)
MCV: 89 fL (ref 80.0–100.0)
Platelets: 527 K/uL — ABNORMAL HIGH (ref 150–400)
RBC: 4.71 MIL/uL (ref 3.87–5.11)
RDW: 14.3 % (ref 11.5–15.5)
WBC: 18.7 K/uL — ABNORMAL HIGH (ref 4.0–10.5)
nRBC: 0 % (ref 0.0–0.2)

## 2024-02-15 LAB — COMPREHENSIVE METABOLIC PANEL WITH GFR
ALT: 20 U/L (ref 0–44)
AST: 15 U/L (ref 15–41)
Albumin: 3.4 g/dL — ABNORMAL LOW (ref 3.5–5.0)
Alkaline Phosphatase: 89 U/L (ref 38–126)
Anion gap: 9 (ref 5–15)
BUN: 16 mg/dL (ref 6–20)
CO2: 25 mmol/L (ref 22–32)
Calcium: 9.1 mg/dL (ref 8.9–10.3)
Chloride: 102 mmol/L (ref 98–111)
Creatinine, Ser: 0.86 mg/dL (ref 0.44–1.00)
GFR, Estimated: >60 mL/min (ref >60)
Glucose, Bld: 123 mg/dL — ABNORMAL HIGH (ref 70–99)
Potassium: 4.4 mmol/L (ref 3.5–5.1)
Sodium: 136 mmol/L (ref 135–145)
Total Bilirubin: 0.9 mg/dL (ref 0.0–1.2)
Total Protein: 6.8 g/dL (ref 6.5–8.1)

## 2024-02-15 LAB — URINALYSIS, ROUTINE W REFLEX MICROSCOPIC
Bilirubin Urine: NEGATIVE
Glucose, UA: NEGATIVE mg/dL
Ketones, ur: NEGATIVE mg/dL
Leukocytes,Ua: NEGATIVE
Nitrite: NEGATIVE
Protein, ur: NEGATIVE mg/dL
Specific Gravity, Urine: 1.012 (ref 1.005–1.030)
pH: 6 (ref 5.0–8.0)

## 2024-02-15 LAB — HCG, SERUM, QUALITATIVE: Preg, Serum: NEGATIVE

## 2024-02-15 LAB — HIV ANTIBODY (ROUTINE TESTING W REFLEX): HIV Screen 4th Generation wRfx: NONREACTIVE

## 2024-02-15 LAB — HEPARIN LEVEL (UNFRACTIONATED): Heparin Unfractionated: 0.34 [IU]/mL (ref 0.30–0.70)

## 2024-02-15 LAB — LIPASE, BLOOD: Lipase: 29 U/L (ref 11–51)

## 2024-02-15 LAB — I-STAT CG4 LACTIC ACID, ED: Lactic Acid, Venous: 0.7 mmol/L (ref 0.5–1.9)

## 2024-02-15 MED ORDER — SODIUM CHLORIDE 0.9% FLUSH
3.0000 mL | Freq: Two times a day (BID) | INTRAVENOUS | Status: DC
  Administered 2024-02-15 – 2024-02-17 (×5): 3 mL via INTRAVENOUS

## 2024-02-15 MED ORDER — HYDROMORPHONE HCL 1 MG/ML IJ SOLN: 0.5000 mg | INTRAMUSCULAR | Status: DC | PRN

## 2024-02-15 MED ORDER — ONDANSETRON HCL 4 MG/2ML IJ SOLN
4.0000 mg | Freq: Once | INTRAMUSCULAR | Status: AC
  Administered 2024-02-15: 4 mg via INTRAVENOUS
  Filled 2024-02-15: qty 2

## 2024-02-15 MED ORDER — HEPARIN (PORCINE) 25000 UT/250ML-% IV SOLN
1300.0000 [IU]/h | INTRAVENOUS | Status: DC
  Administered 2024-02-15 – 2024-02-17 (×3): 1300 [IU]/h via INTRAVENOUS
  Filled 2024-02-15 (×3): qty 250

## 2024-02-15 MED ORDER — HYDROCODONE-ACETAMINOPHEN 5-325 MG PO TABS
1.0000 | ORAL_TABLET | Freq: Four times a day (QID) | ORAL | Status: DC | PRN
  Administered 2024-02-15: 1 via ORAL
  Filled 2024-02-15: qty 1

## 2024-02-15 MED ORDER — HEPARIN BOLUS VIA INFUSION
5000.0000 [IU] | Freq: Once | INTRAVENOUS | Status: AC
  Administered 2024-02-15: 5000 [IU] via INTRAVENOUS
  Filled 2024-02-15: qty 5000

## 2024-02-15 MED ORDER — ONDANSETRON HCL 4 MG/2ML IJ SOLN
4.0000 mg | Freq: Four times a day (QID) | INTRAMUSCULAR | Status: DC | PRN
  Administered 2024-02-15 – 2024-02-16 (×2): 4 mg via INTRAVENOUS
  Filled 2024-02-15 (×2): qty 2

## 2024-02-15 MED ORDER — HEPARIN BOLUS VIA INFUSION
4850.0000 [IU] | Freq: Once | INTRAVENOUS | Status: DC
  Filled 2024-02-15: qty 4850

## 2024-02-15 MED ORDER — MORPHINE SULFATE (PF) 4 MG/ML IV SOLN
4.0000 mg | Freq: Once | INTRAVENOUS | Status: AC
  Administered 2024-02-15: 4 mg via INTRAVENOUS
  Filled 2024-02-15: qty 1

## 2024-02-15 MED ORDER — ACETAMINOPHEN 650 MG RE SUPP
650.0000 mg | Freq: Four times a day (QID) | RECTAL | Status: DC | PRN
Start: 2024-02-15 — End: 2024-02-17

## 2024-02-15 MED ORDER — IOHEXOL 350 MG/ML SOLN
75.0000 mL | Freq: Once | INTRAVENOUS | Status: AC | PRN
  Administered 2024-02-15: 75 mL via INTRAVENOUS

## 2024-02-15 MED ORDER — BUPROPION HCL ER (XL) 150 MG PO TB24
300.0000 mg | ORAL_TABLET | Freq: Every day | ORAL | Status: DC
Start: 2024-02-16 — End: 2024-02-17
  Administered 2024-02-16 – 2024-02-17 (×2): 300 mg via ORAL
  Filled 2024-02-15 (×2): qty 2

## 2024-02-15 MED ORDER — ACETAMINOPHEN 325 MG PO TABS
650.0000 mg | ORAL_TABLET | Freq: Four times a day (QID) | ORAL | Status: DC | PRN
  Administered 2024-02-16 (×2): 650 mg via ORAL
  Filled 2024-02-15 (×2): qty 2

## 2024-02-15 MED ORDER — ESCITALOPRAM OXALATE 10 MG PO TABS
10.0000 mg | ORAL_TABLET | Freq: Every day | ORAL | Status: DC
  Administered 2024-02-16 – 2024-02-17 (×2): 10 mg via ORAL
  Filled 2024-02-15 (×2): qty 1

## 2024-02-15 MED ORDER — LACTATED RINGERS IV BOLUS
1000.0000 mL | Freq: Once | INTRAVENOUS | Status: AC
  Administered 2024-02-15: 1000 mL via INTRAVENOUS

## 2024-02-15 MED ORDER — POLYETHYLENE GLYCOL 3350 17 G PO PACK: 17.0000 g | PACK | Freq: Every day | ORAL | Status: DC | PRN

## 2024-02-15 MED ORDER — PANTOPRAZOLE SODIUM 40 MG IV SOLR: 40.0000 mg | Freq: Once | INTRAVENOUS | Status: DC

## 2024-02-15 NOTE — ED Triage Notes (Signed)
 Pt here for epigastric pain that is sharp in nature with N/V x 3 days; pt sts unable to hold down POs

## 2024-02-15 NOTE — ED Provider Notes (Signed)
 [facility]    CSN: [id number] Arrival date & time: 02/15/24  0804      History   Chief Complaint Chief Complaint  Patient presents with   Abdominal Pain   Vomiting    HPI Leslie Holt is a 43 y.o. female.   Patient here today for evaluation of epigastric pain that started 3 days ago.  She reports that she has had nausea, vomiting and has had trouble holding down p.o.'s.  She has not had fever.  She does have history of having partial pancreatectomy and splenectomy due to splenic laceration during emergency C-section 19 years ago.  She notes symptoms recently did start with some diarrhea.  The history is provided by the patient.    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   Abnormal Pap smear of cervix 07/16/2023   Calculus of gallbladder 07/16/2023   Hemorrhage 07/16/2023   Hyperemesis gravidarum 07/16/2023   Nausea with vomiting 07/16/2023   Need for prophylactic vaccination and inoculation against influenza 07/16/2023   Encounter for insertion of intrauterine contraceptive device (IUD) 07/16/2023   Other specified counseling 07/16/2023   Surveillance of previously prescribed intrauterine contraceptive device 07/16/2023   Peritoneal abscess (HCC) 07/16/2023   Insulin  resistance - new diagnosis 10/07/2022   Vitamin D  deficiency- new diagnosis 10/07/2022   B12 deficiency due to diet-new diagnosis 10/07/2022   Other hyperlipidemia- new diagnosis 10/07/2022   Other fatigue 09/23/2022   SOB (shortness of breath) on exertion 09/23/2022   Morbid obesity (HCC)-start bmi 39.7/date 09/23/22 09/23/2022   BMI 39.0-39.9,adult-current bmi 39.7 09/23/2022   Mood disorder (HCC)- Emotional Eating 09/23/2022   Gastroesophageal reflux disease 09/23/2022   Anxiety 09/23/2022    Attention deficit disorder 09/23/2022   Central adiposity 08/31/2022   Generalized obesity 08/31/2022   Polyphagia 08/31/2022    Past Surgical History:  Procedure Laterality Date   CESAREAN SECTION  2016   pancreas     SPLENECTOMY, TOTAL     TONSILLECTOMY  04/2014   TYMPANOPLASTY  1996   TYMPANOSTOMY TUBE PLACEMENT  1987    OB History   No obstetric history on file.      Home Medications    Prior to Admission medications   Medication Sig Start Date End Date Taking? Authorizing Provider  buPROPion  (WELLBUTRIN  XL) 300 MG 24 hr tablet Take 300 mg by mouth daily.    [provider]  cholecalciferol (VITAMIN D3) 25 MCG (1000 UNIT) tablet Take 1,000 Units by mouth daily.    [provider]  cyanocobalamin  (VITAMIN B12) 500 MCG tablet 300-500 mcg daily 10/07/22   Opalski, Barnie, DO  escitalopram  (LEXAPRO ) 10 MG tablet Take 10 mg by mouth daily.    [provider]  fexofenadine (ALLEGRA) 180 MG tablet Take 180 mg by mouth daily.    [provider]  Norgestimate-Ethinyl Estradiol Triphasic 0.18/0.215/0.25 MG-25 MCG tab Take 1 tablet by mouth daily.    [provider]  pseudoephedrine  (SUDAFED) 120 MG 12 hr tablet Take 1 tablet (120 mg total) by mouth 2 (two) times daily. 07/16/23   Rodriguez-Southworth, Sylvia, PA-C  Semaglutide, 1 MG/DOSE, 4 MG/3ML SOPN Inject 1 mg into the skin once a week.    [provider]  Family History Family History  Problem Relation Age of Onset   Depression Mother    Anxiety disorder Mother    Obesity Mother    Stroke Mother    High blood pressure Father    Alcoholism Father     Social History Social History   Tobacco Use   Smoking status: Never   Smokeless tobacco: Never  Vaping Use   Vaping status: Never Used  Substance Use Topics   Alcohol use: Yes    Comment: occassional   Drug use: Never     Allergies   Goat milk, Latex, Shellfish allergy, and Sulfa antibiotics   Review  of Systems Review of Systems  Constitutional:  Negative for chills and fever.  Eyes:  Negative for discharge and redness.  Respiratory:  Negative for shortness of breath.   Cardiovascular:  Negative for chest pain.  Gastrointestinal:  Positive for abdominal pain, diarrhea, nausea and vomiting.     Physical Exam Triage Vital Signs ED Triage Vitals  Encounter Vitals Group     BP      Girls Systolic BP Percentile      Girls Diastolic BP Percentile      Boys Systolic BP Percentile      Boys Diastolic BP Percentile      Pulse      Resp      Temp      Temp src      SpO2      Weight      Height      Head Circumference      Peak Flow      Pain Score      Pain Loc      Pain Education      Exclude from Growth Chart    No data found.  Updated Vital Signs BP 111/81 (BP Location: Left Arm)   Pulse (!) 110   Temp 98 F (36.7 C) (Oral)   Resp 18   SpO2 96%   Visual Acuity Right Eye Distance:   Left Eye Distance:   Bilateral Distance:    Right Eye Near:   Left Eye Near:    Bilateral Near:     Physical Exam Vitals and nursing note reviewed.  Constitutional:      General: She is not in acute distress.    Appearance: Normal appearance. She is not ill-appearing.  HENT:     Head: Normocephalic and atraumatic.  Eyes:     Conjunctiva/sclera: Conjunctivae normal.  Cardiovascular:     Rate and Rhythm: Regular rhythm. Tachycardia present.  Pulmonary:     Effort: Pulmonary effort is normal. No respiratory distress.     Breath sounds: Normal breath sounds. No wheezing, rhonchi or rales.  Abdominal:     General: Abdomen is flat. Bowel sounds are normal. There is no distension.     Palpations: Abdomen is soft.     Tenderness: There is abdominal tenderness (epigsatric). There is no guarding or rebound.  Neurological:     Mental Status: She is alert.  Psychiatric:        Mood and Affect: Mood normal.        Behavior: Behavior normal.        Thought Content: Thought content  normal.      UC Treatments / Results  Labs (all labs ordered are listed, but only abnormal results are displayed) Labs Reviewed - No data to display  EKG   Radiology No results found.  Procedures Procedures (including critical care  time)  Medications Ordered in UC Medications - No data to display  Initial Impression / Assessment and Plan / UC Course  I have reviewed the triage vital signs and the nursing notes.  Pertinent labs & imaging results that were available during my care of the patient were reviewed by me and considered in my medical decision making (see chart for details).    EKG without ST elevation.  Recommended further evaluation in the emergency department for imaging and stat labs.  Patient is agreeable to same.  She feels stable to transport herself via POV.  Final Clinical Impressions(s) / UC Diagnoses   Final diagnoses:  Abdominal pain, epigastric   Discharge Instructions   None    ED Prescriptions   None    PDMP not reviewed this encounter.   Billy Asberry FALCON, PA-C 02/15/24 [phone]

## 2024-02-15 NOTE — ED Notes (Addendum)
 Pt transported to Xray.

## 2024-02-15 NOTE — ED Notes (Signed)
 Patient is being discharged from the Urgent Care and sent to the Emergency Department via POV . Per RM, patient is in need of higher level of care due to abd pain, vomiting, dehydration. Patient is aware and verbalizes understanding of plan of care.  Vitals:   02/15/24 0831  BP: 111/81  Pulse: (!) 110  Resp: 18  Temp: 98 F (36.7 C)  SpO2: 96%

## 2024-02-15 NOTE — H&P (Signed)
 History and Physical   Leslie Holt FMW:[id number] DOB: 02-04-1981 DOA: 02/15/2024  PCP: Barbra Odor, NP   Patient coming from: Home  Chief Complaint: Abdominal pain, nausea, diarrhea, vomiting  HPI: Leslie Holt is a 43 y.o. female with medical history significant of GERD, anxiety, ADD, obesity presenting with abdominal pain with associated nausea vomiting and diarrhea.  Patient has had 4 days of worsening abdominal pain.  Initially had some diarrhea but this has since improved.  Diarrhea was nonbloody.  Also reports nausea and vomiting that was nonbloody nonbilious.  No fevers, chills, chest pain, shortness of breath.  ED Course: Vital signs in the ED notable for heart rate in the 90s-120s.  Lab workup showed CMP with glucose 123, albumin 3.4.  CBC with leukocytosis to 18.7, platelets 527.  Lipase normal.  Urinalysis with hemoglobin, rare bacteria. Chest x-ray showed new right lower lobe opacity consistent with atelectasis.  CT abdomen pelvis showed hepatic steatosis with portal vein thrombosis.  Right upper quadrant ultrasound showed changes consistent with steatosis but no acute normality.  Patient started on morphine , Zofran , 1 L IV fluids, heparin  infusion.  GI consulted by phone by EDP who recommended continue with anticoagulation and consider if hematology follow-up.  Review of Systems: As per HPI otherwise all other systems reviewed and are negative.  Past Medical History:  Diagnosis Date   ADD (attention deficit disorder)    Anxiety    Arthritis    Asthma    Depression    Heartburn     Past Surgical History:  Procedure Laterality Date   CESAREAN SECTION  2016   pancreas     SPLENECTOMY, TOTAL     TONSILLECTOMY  04/2014   TYMPANOPLASTY  1996   TYMPANOSTOMY TUBE PLACEMENT  1987    Social History  reports that she has never smoked. She has never used smokeless tobacco. She reports current alcohol use. She reports that she does not use drugs.  Allergies   Allergen Reactions   Goat Milk    Latex Rash   Shellfish Allergy Swelling and Rash   Sulfa Antibiotics Swelling and Rash    Family History  Problem Relation Age of Onset   Depression Mother    Anxiety disorder Mother    Obesity Mother    Stroke Mother    High blood pressure Father    Alcoholism Father   Reviewed on admission  Prior to Admission medications   Medication Sig Start Date End Date Taking? Authorizing Provider  buPROPion  (WELLBUTRIN  XL) 300 MG 24 hr tablet Take 300 mg by mouth daily.    [provider]  cholecalciferol (VITAMIN D3) 25 MCG (1000 UNIT) tablet Take 1,000 Units by mouth daily.    [provider]  cyanocobalamin  (VITAMIN B12) 500 MCG tablet 300-500 mcg daily 10/07/22   Opalski, Barnie, DO  escitalopram  (LEXAPRO ) 10 MG tablet Take 10 mg by mouth daily.    [provider]  fexofenadine (ALLEGRA) 180 MG tablet Take 180 mg by mouth daily.    [provider]  Norgestimate-Ethinyl Estradiol Triphasic 0.18/0.215/0.25 MG-25 MCG tab Take 1 tablet by mouth daily.    [provider]  predniSONE  (STERAPRED UNI-PAK 48 TAB) 10 MG (48) TBPK tablet Take by mouth as directed. 02/01/24   [provider]  pseudoephedrine  (SUDAFED) 120 MG 12 hr tablet Take 1 tablet (120 mg total) by mouth 2 (two) times daily. 07/16/23   Rodriguez-Southworth, Sylvia, PA-C  Semaglutide, 1 MG/DOSE, 4 MG/3ML SOPN Inject 1 mg into the  skin once a week.    [provider]  triamcinolone (NASACORT) 55 MCG/ACT AERO nasal inhaler Place 2 sprays into the nose daily. 01/13/24   [provider]    Physical Exam: Vitals:   02/15/24 1045 02/15/24 1130 02/15/24 1145 02/15/24 1409  BP: 100/63 112/67 117/71   Pulse: 95 92 94 (!) 114  Resp: 15 13 11    Temp:    98.9 F (37.2 C)  TempSrc:    Oral  SpO2: 100% 100% 100% 100%  Weight:      Height:        Physical Exam Constitutional:      General: She is not in acute distress.     Appearance: Normal appearance. She is obese.  HENT:     Head: Normocephalic and atraumatic.     Mouth/Throat:     Mouth: Mucous membranes are moist.     Pharynx: Oropharynx is clear.  Eyes:     Extraocular Movements: Extraocular movements intact.     Pupils: Pupils are equal, round, and reactive to light.  Cardiovascular:     Rate and Rhythm: Normal rate and regular rhythm.     Pulses: Normal pulses.     Heart sounds: Normal heart sounds.  Pulmonary:     Effort: Pulmonary effort is normal. No respiratory distress.     Breath sounds: Normal breath sounds.  Abdominal:     General: Bowel sounds are normal. There is no distension.     Palpations: Abdomen is soft.     Tenderness: There is abdominal tenderness.  Musculoskeletal:        General: No swelling or deformity.  Skin:    General: Skin is warm and dry.  Neurological:     General: No focal deficit present.     Mental Status: Mental status is at baseline.    Labs on Admission: I have personally reviewed following labs and imaging studies  CBC: Recent Labs  Lab 02/15/24 1005  WBC 18.7*  HGB 14.0  HCT 41.9  MCV 89.0  PLT 527*    Basic Metabolic Panel: Recent Labs  Lab 02/15/24 1005  NA 136  K 4.4  CL 102  CO2 25  GLUCOSE 123*  BUN 16  CREATININE 0.86  CALCIUM 9.1    GFR: Estimated Creatinine Clearance: 101.1 mL/min (by C-G formula based on SCr of 0.86 mg/dL).  Liver Function Tests: Recent Labs  Lab 02/15/24 1005  AST 15  ALT 20  ALKPHOS 89  BILITOT 0.9  PROT 6.8  ALBUMIN 3.4*    Urine analysis:    Component Value Date/Time   COLORURINE YELLOW 02/15/2024 1227   APPEARANCEUR CLEAR 02/15/2024 1227   LABSPEC 1.012 02/15/2024 1227   PHURINE 6.0 02/15/2024 1227   GLUCOSEU NEGATIVE 02/15/2024 1227   HGBUR SMALL (A) 02/15/2024 1227   BILIRUBINUR NEGATIVE 02/15/2024 1227   KETONESUR NEGATIVE 02/15/2024 1227   PROTEINUR NEGATIVE 02/15/2024 1227   NITRITE NEGATIVE 02/15/2024 1227   LEUKOCYTESUR  NEGATIVE 02/15/2024 1227    Radiological Exams on Admission: CT ABDOMEN PELVIS W CONTRAST Addendum Date: 02/15/2024 ADDENDUM REPORT: 02/15/2024 14:31 Electronically Signed   By: Lynwood Landy Raddle M.D.   On: 02/15/2024 14:31   Addendum Date: 02/15/2024 ADDENDUM REPORT: 02/15/2024 14:29 ADDENDUM: Upon further review, there does appear to be thrombus involving 2 branches of the right portal vein. Critical Value/emergent results were called by telephone at the time of interpretation on 02/15/2024 at 2:27 pm to provider [facility] , who verbally acknowledged  these results. Electronically Signed   By: Lynwood Landy Raddle M.D.   On: 02/15/2024 14:29   Result Date: 02/15/2024 CLINICAL DATA:  Acute epigastric abdominal pain for 3 days. EXAM: CT ABDOMEN AND PELVIS WITH CONTRAST TECHNIQUE: Multidetector CT imaging of the abdomen and pelvis was performed using the standard protocol following bolus administration of intravenous contrast. RADIATION DOSE REDUCTION: This exam was performed according to the departmental dose-optimization program which includes automated exposure control, adjustment of the mA and/or kV according to patient size and/or use of iterative reconstruction technique. CONTRAST:  75mL OMNIPAQUE  IOHEXOL  350 MG/ML SOLN COMPARISON:  None Available. FINDINGS: Lower chest: Minimal right basilar subsegmental atelectasis is noted. Hepatobiliary: Hepatic steatosis. No cholelithiasis or biliary dilatation. Pancreas: Unremarkable. No pancreatic ductal dilatation or surrounding inflammatory changes. Spleen: Normal in size without focal abnormality. Adrenals/Urinary Tract: Adrenal glands are unremarkable. Kidneys are normal, without renal calculi, focal lesion, or hydronephrosis. Bladder is unremarkable. Stomach/Bowel: Stomach is within normal limits. Appendix appears normal. No evidence of bowel wall thickening, distention, or inflammatory changes. Vascular/Lymphatic: No significant vascular findings are present.  No enlarged abdominal or pelvic lymph nodes. Reproductive: Uterus and bilateral adnexa are unremarkable. Other: No abdominal wall hernia or abnormality. No abdominopelvic ascites. Musculoskeletal: No acute or significant osseous findings. IMPRESSION: 1. Hepatic steatosis. 2. No acute abnormality seen in the abdomen or pelvis. Electronically Signed: By: Lynwood Landy Raddle M.D. On: 02/15/2024 12:40   US  Abdomen Limited RUQ (LIVER/GB) Result Date: 02/15/2024 EXAM: Right Upper Quadrant Abdominal Ultrasound 02/15/2024 02:12:24 PM TECHNIQUE: Real-time ultrasonography of the right upper quadrant of the abdomen was performed. COMPARISON: CT of earlier today. CLINICAL HISTORY: RUQ abdominal pain. FINDINGS: LIVER: The liver demonstrates mildly increased echogenicity. No intrahepatic biliary ductal dilatation. BILIARY SYSTEM: No pericholecystic fluid or wall thickening. No cholelithiasis. Negative sonographic Murphy's sign. Common bile duct is within normal limits measuring 2 mm. unremarkable. OTHER: No right upper quadrant ascites. IMPRESSION: 1. No acute findings. 2. Increased hepatic echogenicity, favoring steatosis. Electronically signed by: Rockey Kilts MD 02/15/2024 02:18 PM EDT RP Workstation: [id number]   DG Chest 2 View Result Date: 02/15/2024 CLINICAL DATA:  Midsternal chest pain. Epigastric pain for 3 days. Nausea and vomiting. EXAM: CHEST - 2 VIEW COMPARISON:  Radiographs 02/03/2024. FINDINGS: The heart size and mediastinal contours are stable. There are new linear opacities at the right lung base, most consistent with atelectasis. No confluent airspace disease, edema, pleural effusion or pneumothorax. The bones appear unremarkable. IMPRESSION: New linear opacities at the right lung base, most consistent with atelectasis. No evidence of pneumonia or edema. Electronically Signed   By: Elsie Perone M.D.   On: 02/15/2024 10:14   EKG: Independently reviewed.  Sinus tachycardia at 116 bpm.  Low voltage multiple  leads.  Nonspecific T wave changes.  Assessment/Plan Active Problems:   Generalized obesity   Gastroesophageal reflux disease   Anxiety   Attention deficit disorder   Portal vein thrombosis > Patient presenting with several days abdominal pain with associated nausea vomiting and diarrhea.  Initially CT appear to show no acute abnormality but on second look it was noted that patient had thrombus involving 2 branches of the portal vein, more subtle changes as this was a contrasted study but not a an angiogram.. > Started on heparin  infusion in the ED.  Received morphine  and Zofran  as well IV fluids. > Etiology unclear.  May need hematology workup long-term. Possibly hormonal related, on OCP. - Monitor on progressive overnight - Continue with heparin  - Pain control  as needed - Advance diet as tolerated - No obvious etiology for this  Anxiety - Continue bupropion , Lexapro   Obesity - Noted  DVT prophylaxis: Heparin  Code Status:   Full Family Communication:  None on admission  Disposition Plan:   Patient is from:  Home  Anticipated DC to:  Home  Anticipated DC date:  1 to 3 days  Anticipated DC barriers: None  Consults called:  EDP spoke with GI, no formal consult. Admission status:  Observation, progressive  Severity of Illness: The appropriate patient status for this patient is OBSERVATION. Observation status is judged to be reasonable and necessary in order to provide the required intensity of service to ensure the patient's safety. The patient's presenting symptoms, physical exam findings, and initial radiographic and laboratory data in the context of their medical condition is felt to place them at decreased risk for further clinical deterioration. Furthermore, it is anticipated that the patient will be medically stable for discharge from the hospital within 2 midnights of admission.    Marsa KATHEE Scurry MD [facility]  How to contact the [facility] Attending or Consulting  provider 7A - 7P or covering provider during after hours 7P -7A, for this patient?   Check the care team in [facility] and look for a) attending/consulting [facility] provider listed and b) the [facility] team listed Log into [email or web address] and use [place]'s universal password to access. If you do not have the password, please contact the hospital operator. Locate the [facility] provider you are looking for under [facility] and page to a number that you can be directly reached. If you still have difficulty reaching the provider, please page the [facility] (Director on Call) for the Hospitalists listed on amion for assistance.  02/15/2024, 3:41 PM

## 2024-02-15 NOTE — Progress Notes (Addendum)
 PHARMACY - ANTICOAGULATION CONSULT NOTE  Pharmacy Consult for IV Heparin  infusion Indication: DVT  Allergies  Allergen Reactions   Goat Milk    Latex Rash   Shellfish Allergy Swelling and Rash   Sulfa Antibiotics Swelling and Rash    Patient Measurements: Height: 5' 5 (165.1 cm) Weight: 104.3 kg (230 lb) IBW/kg (Calculated) : 57 HEPARIN  DW (KG): 81.2  Vital Signs: Temp: 98.9 F (37.2 C) (08/19 1409) Temp Source: Oral (08/19 1409) BP: 117/71 (08/19 1145) Pulse Rate: 114 (08/19 1409)  Labs: Recent Labs    02/15/24 1005  HGB 14.0  HCT 41.9  PLT 527*  CREATININE 0.86    Estimated Creatinine Clearance: 101.1 mL/min (by C-G formula based on SCr of 0.86 mg/dL).   Medical History: Past Medical History:  Diagnosis Date   ADD (attention deficit disorder)    Anxiety    Arthritis    Asthma    Depression    Heartburn     Medications:  (Not in a hospital admission)  Scheduled:    morphine  injection  4 mg Intravenous Once   Infusions:  PRN:   Assessment: 43 YO F with PMH significant for Anxiety, ADD, SOB, and Obesity presents to the ED for abdominal pain and vomiting. Patient found to have hepatic vein thrombosis. Pharmacy consulted for IV heparin  for treatment.  Hgb 14.0, Hct 41.9, Plt 527  Goal of Therapy:  Heparin  level 0.3-0.7 units/ml Monitor platelets by anticoagulation protocol: Yes   Plan:  Give 5000 units bolus x 1 Start heparin  infusion at 1300 units/hr Check anti-Xa level in 6 hours and daily while on heparin  Continue to monitor H&H and platelets  R. Samual Satterfield, PharmD PGY-1 Acute Care Pharmacy Resident [facility] 02/15/2024 3:25 PM

## 2024-02-15 NOTE — ED Notes (Signed)
 Pt transported to CT ?

## 2024-02-15 NOTE — ED Provider Notes (Signed)
 [place] [facility] Provider Note   CSN: [id number] Arrival date & time: 02/15/24  0909     Patient presents with: Abdominal Pain, Diarrhea, Emesis, and Nausea   Leslie Holt is a 43 y.o. female.    Abdominal Pain Associated symptoms: diarrhea and vomiting   Diarrhea Associated symptoms: abdominal pain and vomiting   Emesis Associated symptoms: abdominal pain and diarrhea    Patient is a 43 year old female with past medical history of significant for depression, anxiety, asthma past surgical history notable for total splenectomy and partial pancreatectomy, C-section  Patient states for the past 4 days she has had gradually worsening epigastric abdominal pain did have some diarrhea initially which is resolved no blood in her stool no fevers or chills has had multiple episodes of vomiting no coffee-ground emesis. No history of VTE no shortness of breath or chest pain.      Prior to Admission medications   Medication Sig Start Date End Date Taking? Authorizing Provider  buPROPion  (WELLBUTRIN  XL) 300 MG 24 hr tablet Take 300 mg by mouth daily.    [provider]  cholecalciferol (VITAMIN D3) 25 MCG (1000 UNIT) tablet Take 1,000 Units by mouth daily.    [provider]  cyanocobalamin  (VITAMIN B12) 500 MCG tablet 300-500 mcg daily 10/07/22   Opalski, Barnie, DO  escitalopram  (LEXAPRO ) 10 MG tablet Take 10 mg by mouth daily.    [provider]  fexofenadine (ALLEGRA) 180 MG tablet Take 180 mg by mouth daily.    [provider]  Norgestimate-Ethinyl Estradiol Triphasic 0.18/0.215/0.25 MG-25 MCG tab Take 1 tablet by mouth daily.    [provider]  predniSONE  (STERAPRED UNI-PAK 48 TAB) 10 MG (48) TBPK tablet Take by mouth as directed. 02/01/24   [provider]  pseudoephedrine  (SUDAFED) 120 MG 12 hr tablet Take 1 tablet (120 mg total) by mouth 2 (two) times daily. 07/16/23   Rodriguez-Southworth,  Sylvia, PA-C  Semaglutide, 1 MG/DOSE, 4 MG/3ML SOPN Inject 1 mg into the skin once a week.    [provider]  triamcinolone (NASACORT) 55 MCG/ACT AERO nasal inhaler Place 2 sprays into the nose daily. 01/13/24   [provider]    Allergies: Goat milk, Latex, Shellfish allergy, and Sulfa antibiotics    Review of Systems  Gastrointestinal:  Positive for abdominal pain, diarrhea and vomiting.    Updated Vital Signs BP 117/71   Pulse (!) 114   Temp 98.9 F (37.2 C) (Oral)   Resp 11   Ht 5' 5 (1.651 m)   Wt 104.3 kg   LMP 02/06/2024 (Approximate)   SpO2 100%   BMI 38.27 kg/m   Physical Exam Vitals and nursing note reviewed.  Constitutional:      General: She is in acute distress.     Appearance: She is obese.  HENT:     Head: Normocephalic and atraumatic.     Nose: Nose normal.  Eyes:     General: No scleral icterus. Cardiovascular:     Rate and Rhythm: Normal rate and regular rhythm.     Pulses: Normal pulses.     Heart sounds: Normal heart sounds.  Pulmonary:     Effort: Pulmonary effort is normal. No respiratory distress.     Breath sounds: No wheezing.  Abdominal:     Palpations: Abdomen is soft.     Tenderness: There is abdominal tenderness in the right upper quadrant and epigastric area.  Musculoskeletal:     Cervical  back: Normal range of motion.     Right lower leg: No edema.     Left lower leg: No edema.  Skin:    General: Skin is warm and dry.     Capillary Refill: Capillary refill takes less than 2 seconds.  Neurological:     Mental Status: She is alert. Mental status is at baseline.  Psychiatric:        Mood and Affect: Mood normal.        Behavior: Behavior normal.     (all labs ordered are listed, but only abnormal results are displayed) Labs Reviewed  COMPREHENSIVE METABOLIC PANEL WITH GFR - Abnormal; Notable for the following components:      Result Value   Glucose, Bld 123 (*)    Albumin 3.4 (*)    All other components  within normal limits  CBC - Abnormal; Notable for the following components:   WBC 18.7 (*)    Platelets 527 (*)    All other components within normal limits  URINALYSIS, ROUTINE W REFLEX MICROSCOPIC - Abnormal; Notable for the following components:   Hgb urine dipstick SMALL (*)    Bacteria, UA RARE (*)    All other components within normal limits  LIPASE, BLOOD  HCG, SERUM, QUALITATIVE    EKG: None  Radiology: CT ABDOMEN PELVIS W CONTRAST Addendum Date: 02/15/2024 ADDENDUM REPORT: 02/15/2024 14:31 Electronically Signed   By: Lynwood Landy Raddle M.D.   On: 02/15/2024 14:31   Addendum Date: 02/15/2024 ADDENDUM REPORT: 02/15/2024 14:29 ADDENDUM: Upon further review, there does appear to be thrombus involving 2 branches of the right portal vein. Critical Value/emergent results were called by telephone at the time of interpretation on 02/15/2024 at 2:27 pm to provider [facility] , who verbally acknowledged these results. Electronically Signed   By: Lynwood Landy Raddle M.D.   On: 02/15/2024 14:29   Result Date: 02/15/2024 CLINICAL DATA:  Acute epigastric abdominal pain for 3 days. EXAM: CT ABDOMEN AND PELVIS WITH CONTRAST TECHNIQUE: Multidetector CT imaging of the abdomen and pelvis was performed using the standard protocol following bolus administration of intravenous contrast. RADIATION DOSE REDUCTION: This exam was performed according to the departmental dose-optimization program which includes automated exposure control, adjustment of the mA and/or kV according to patient size and/or use of iterative reconstruction technique. CONTRAST:  75mL OMNIPAQUE  IOHEXOL  350 MG/ML SOLN COMPARISON:  None Available. FINDINGS: Lower chest: Minimal right basilar subsegmental atelectasis is noted. Hepatobiliary: Hepatic steatosis. No cholelithiasis or biliary dilatation. Pancreas: Unremarkable. No pancreatic ductal dilatation or surrounding inflammatory changes. Spleen: Normal in size without focal abnormality.  Adrenals/Urinary Tract: Adrenal glands are unremarkable. Kidneys are normal, without renal calculi, focal lesion, or hydronephrosis. Bladder is unremarkable. Stomach/Bowel: Stomach is within normal limits. Appendix appears normal. No evidence of bowel wall thickening, distention, or inflammatory changes. Vascular/Lymphatic: No significant vascular findings are present. No enlarged abdominal or pelvic lymph nodes. Reproductive: Uterus and bilateral adnexa are unremarkable. Other: No abdominal wall hernia or abnormality. No abdominopelvic ascites. Musculoskeletal: No acute or significant osseous findings. IMPRESSION: 1. Hepatic steatosis. 2. No acute abnormality seen in the abdomen or pelvis. Electronically Signed: By: Lynwood Landy Raddle M.D. On: 02/15/2024 12:40   US  Abdomen Limited RUQ (LIVER/GB) Result Date: 02/15/2024 EXAM: Right Upper Quadrant Abdominal Ultrasound 02/15/2024 02:12:24 PM TECHNIQUE: Real-time ultrasonography of the right upper quadrant of the abdomen was performed. COMPARISON: CT of earlier today. CLINICAL HISTORY: RUQ abdominal pain. FINDINGS: LIVER: The liver demonstrates mildly increased echogenicity. No intrahepatic biliary  ductal dilatation. BILIARY SYSTEM: No pericholecystic fluid or wall thickening. No cholelithiasis. Negative sonographic Murphy's sign. Common bile duct is within normal limits measuring 2 mm. unremarkable. OTHER: No right upper quadrant ascites. IMPRESSION: 1. No acute findings. 2. Increased hepatic echogenicity, favoring steatosis. Electronically signed by: Rockey Kilts MD 02/15/2024 02:18 PM EDT RP Workstation: [id number]   DG Chest 2 View Result Date: 02/15/2024 CLINICAL DATA:  Midsternal chest pain. Epigastric pain for 3 days. Nausea and vomiting. EXAM: CHEST - 2 VIEW COMPARISON:  Radiographs 02/03/2024. FINDINGS: The heart size and mediastinal contours are stable. There are new linear opacities at the right lung base, most consistent with atelectasis. No confluent  airspace disease, edema, pleural effusion or pneumothorax. The bones appear unremarkable. IMPRESSION: New linear opacities at the right lung base, most consistent with atelectasis. No evidence of pneumonia or edema. Electronically Signed   By: Elsie Perone M.D.   On: 02/15/2024 10:14     .Critical Care  Performed by: Neldon Hamp RAMAN, PA Authorized by: Neldon Hamp RAMAN, PA   Critical care provider statement:    Critical care time (minutes):  35   Critical care time was exclusive of:  Separately billable procedures and treating other patients and teaching time   Critical care was necessary to treat or prevent imminent or life-threatening deterioration of the following conditions: Venous thromboembolism.   Critical care was time spent personally by me on the following activities:  Development of treatment plan with patient or surrogate, review of old charts, re-evaluation of patient's condition, pulse oximetry, ordering and review of radiographic studies, ordering and review of laboratory studies, ordering and performing treatments and interventions, obtaining history from patient or surrogate, examination of patient and evaluation of patient's response to treatment   Care discussed with: admitting provider      Medications Ordered in the ED  morphine  (PF) 4 MG/ML injection 4 mg (has no administration in time range)  ondansetron  (ZOFRAN ) injection 4 mg (4 mg Intravenous Given 02/15/24 1021)  lactated ringers  bolus 1,000 mL (0 mLs Intravenous Stopped 02/15/24 1434)  iohexol  (OMNIPAQUE ) 350 MG/ML injection 75 mL (75 mLs Intravenous Contrast Given 02/15/24 1214)    Clinical Course as of 02/15/24 1509  Tue Feb 15, 2024  1001 Partial pacreatectectomy Splenectomy  [WF]  1426 Portal vein thrombosis (R hepatic vein).  [WF]    Clinical Course User Index [WF] Neldon Hamp RAMAN, [place]                                 Medical Decision Making Amount and/or Complexity of Data Reviewed Labs:  ordered. Radiology: ordered.  Risk Prescription drug management. Decision regarding hospitalization.    This patient presents to the ED for concern of abd pain, this involves a number of treatment options, and is a complaint that carries with it a moderate to high risk of complications and morbidity. A differential diagnosis was considered for the patient's symptoms which is discussed below:   The causes of generalized abdominal pain include but are not limited to AAA, mesenteric ischemia, appendicitis, diverticulitis, DKA, gastritis, gastroenteritis, AMI, nephrolithiasis, pancreatitis, peritonitis, adrenal insufficiency,lead poisoning, iron toxicity, intestinal ischemia, constipation, UTI,SBO/LBO, splenic rupture, biliary disease, IBD, IBS, PUD, or hepatitis.   Co morbidities: Discussed in HPI   Brief History:  Patient is a 43 year old female with past medical history of significant for depression, anxiety, asthma past surgical history notable for total splenectomy and partial pancreatectomy, C-section  Patient states for the past 4 days she has had gradually worsening epigastric abdominal pain did have some diarrhea initially which is resolved no blood in her stool no fevers or chills has had multiple episodes of vomiting no coffee-ground emesis. No history of VTE no shortness of breath or chest pain.    EMR reviewed including pt PMHx, past surgical history and past visits to ER.   See HPI for more details   Lab Tests:  I ordered and independently interpreted labs. Labs notable for     Imaging Studies:  Abnormal findings. I personally reviewed all imaging studies. Imaging notable for US  negative   CT abd pain Upon further review, there does appear to be thrombus involving 2 branches of the right portal vein. Critical Value/emergent results were called by telephone at the time of interpretation on 02/15/2024 at 2:27 pm to provider [facility] , who verbally  acknowledged these results.  Cardiac Monitoring:  The patient was maintained on a cardiac monitor.  I personally viewed and interpreted the cardiac monitored which showed an underlying rhythm of: sinus tachycardia EKG non-ischemic   Medicines ordered:  I ordered medication including heparin , morphine , Zofran  for pain, nausea, vomiting, VTE Reevaluation of the patient after these medicines showed that the patient improved I have reviewed the patients home medicines and have made adjustments as needed   Critical Interventions:   heparinization   Consults/Attending Physician   I requested consultation with Vina of GI,  and discussed lab and imaging findings as well as pertinent plan - they recommend: no specific recommendations  Discussed w/ Dr. Seena who will admit.   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 Admitted for further workup and anticoagulation.    Dispostion:  After consideration of the diagnostic results and the patients response to treatment, I feel that the patent would benefit from admission   Final diagnoses:  Hepatic vein thrombosis [facility])    ED Discharge Orders     None          Neldon Hamp RAMAN, [place] 02/15/24 1610    Elnor Jayson LABOR, DO 02/23/24 [phone]

## 2024-02-15 NOTE — ED Triage Notes (Signed)
 Pt. Stated,, I started having diarrhea on Saturday night and then my upper stomach started hurting really bad  with N/V None of it has gone away since Saturday except the diarrhea. Ive not been able to eat anything or drink since Saturday.I went to [facility] and they sent me here for further evaluation.

## 2024-02-15 NOTE — Progress Notes (Signed)
 PHARMACY - ANTICOAGULATION CONSULT NOTE  Pharmacy Consult for IV Heparin  infusion Indication: DVT  Allergies  Allergen Reactions   Shellfish Allergy Diarrhea and Nausea And Vomiting   Sulfa Antibiotics Swelling and Rash    Hx of reaction to eye drops   Goat Milk Diarrhea and Nausea And Vomiting   Latex Rash    Patient Measurements: Height: 5' 5 (165.1 cm) Weight: 100.8 kg (222 lb 3.6 oz) IBW/kg (Calculated) : 57 HEPARIN  DW (KG): 81.2  Vital Signs: Temp: 98.7 F (37.1 C) (08/19 1922) Temp Source: Oral (08/19 1922) BP: 126/65 (08/19 1922) Pulse Rate: 103 (08/19 1922)  Labs: Recent Labs    02/15/24 1005 02/15/24 2136  HGB 14.0  --   HCT 41.9  --   PLT 527*  --   HEPARINUNFRC  --  0.34  CREATININE 0.86  --     Estimated Creatinine Clearance: 99.2 mL/min (by C-G formula based on SCr of 0.86 mg/dL).   Assessment: 43 YO F with PMH significant for Anxiety, ADD, SOB, and Obesity presents to the ED for abdominal pain and vomiting. Patient found to have hepatic vein thrombosis. Pharmacy consulted for IV heparin  for treatment.  Heparin  level therapeutic (0.34) on infusion at 1300 units/hr. No bleeding noted.  Goal of Therapy:  Heparin  level 0.3-0.7 units/ml Monitor platelets by anticoagulation protocol: Yes   Plan:  Continue heparin  infusion at 1300 units/hr Will f/u confirmatory heparin  level in a.m.  Vito Ralph, PharmD, BCPS Please see amion for complete clinical pharmacist phone list 02/15/2024 10:11 PM

## 2024-02-16 ENCOUNTER — Other Ambulatory Visit (HOSPITAL_COMMUNITY): Payer: Self-pay

## 2024-02-16 DIAGNOSIS — K219 Gastro-esophageal reflux disease without esophagitis: Secondary | ICD-10-CM | POA: Diagnosis not present

## 2024-02-16 DIAGNOSIS — F419 Anxiety disorder, unspecified: Secondary | ICD-10-CM | POA: Diagnosis not present

## 2024-02-16 DIAGNOSIS — I81 Portal vein thrombosis: Secondary | ICD-10-CM | POA: Diagnosis not present

## 2024-02-16 LAB — COMPREHENSIVE METABOLIC PANEL WITH GFR
ALT: 17 U/L (ref 0–44)
AST: 12 U/L — ABNORMAL LOW (ref 15–41)
Albumin: 2.9 g/dL — ABNORMAL LOW (ref 3.5–5.0)
Alkaline Phosphatase: 74 U/L (ref 38–126)
Anion gap: 12 (ref 5–15)
BUN: 9 mg/dL (ref 6–20)
CO2: 23 mmol/L (ref 22–32)
Calcium: 8.7 mg/dL — ABNORMAL LOW (ref 8.9–10.3)
Chloride: 101 mmol/L (ref 98–111)
Creatinine, Ser: 0.75 mg/dL (ref 0.44–1.00)
GFR, Estimated: >60 mL/min (ref >60)
Glucose, Bld: 108 mg/dL — ABNORMAL HIGH (ref 70–99)
Potassium: 4.1 mmol/L (ref 3.5–5.1)
Sodium: 136 mmol/L (ref 135–145)
Total Bilirubin: 0.7 mg/dL (ref 0.0–1.2)
Total Protein: 5.9 g/dL — ABNORMAL LOW (ref 6.5–8.1)

## 2024-02-16 LAB — CBC
HCT: 38.7 % (ref 36.0–46.0)
Hemoglobin: 12.5 g/dL (ref 12.0–15.0)
MCH: 29.8 pg (ref 26.0–34.0)
MCHC: 32.3 g/dL (ref 30.0–36.0)
MCV: 92.4 fL (ref 80.0–100.0)
Platelets: 423 K/uL — ABNORMAL HIGH (ref 150–400)
RBC: 4.19 MIL/uL (ref 3.87–5.11)
RDW: 14.4 % (ref 11.5–15.5)
WBC: 16.6 K/uL — ABNORMAL HIGH (ref 4.0–10.5)
nRBC: 0 % (ref 0.0–0.2)

## 2024-02-16 LAB — HEPARIN LEVEL (UNFRACTIONATED): Heparin Unfractionated: 0.38 [IU]/mL (ref 0.30–0.70)

## 2024-02-16 NOTE — Hospital Course (Signed)
 43 y.o. female with medical history significant of GERD, anxiety, ADD, obesity presenting with abdominal pain with associated nausea vomiting and diarrhea.  Found to have portal vein thrombosis.    Assessment and Plan:   Portal Vein Thrombosis -several days abdominal pain with associated nausea vomiting and diarrhea. Initially CT appear to show no acute abnormality but on second look it was noted that patient had thrombus involving 2 branches of the portal vein, more subtle changes as this was a contrasted study but not a an angiogram.  Currently on heparin  drip.  IV fluids and prn pain meds on board.  Still having nausea malaise.  Etiology unclear however may be related to OCP.  Once able to tolerate PO, will transition to PO DOAC.  Advance diet as tolerated.     Anxiety -bupropion  and lexapro  on board.   GERD -PPI prn.

## 2024-02-16 NOTE — TOC CM/SW Note (Addendum)
 Transition of Care [facility]) - Inpatient Brief Assessment   Patient Details  Name: Leslie Holt MRN: [id number] Date of Birth: 1981/03/24  [facility] CM/SW Contact:    Waddell Barnie Rama, RN Phone Number: 02/16/2024, 3:19 PM   Clinical Narrative: From home with boyfriend and son, has PCP, Prentice Goltz and insurance on file [place] ,patient  ID [id number], RX bin [phone], RX group [id number]     [employer] PLAN NUMBER [id number] for prescriptions  states has no HH services in place at this time or DME at home.  States family member will transport them home at [employer] and family is support system, states gets medications from [facility] on [address]  Pta self ambulatory.   Her with Portal vein thrombus, on hep drip will need po DOAC. The insurance infor is not working, she will need a 30 day free coupon for which ever doac they will put her on and doctor's office will need to do patient assist for her.    Transition of Care Asessment: Insurance and Status: Insurance coverage has been reviewed Patient has primary care physician: Yes Home environment has been reviewed: home with boyfriend and son Prior level of function:: indep Prior/Current Home Services: No current home services Social Drivers of Health Review: SDOH reviewed no interventions necessary Readmission risk has been reviewed: Yes Transition of care needs: no transition of care needs at this time

## 2024-02-16 NOTE — Plan of Care (Signed)
  Problem: Clinical Measurements: Goal: Will remain free from infection Outcome: Progressing   Problem: Activity: Goal: Risk for activity intolerance will decrease Outcome: Progressing   Problem: Safety: Goal: Ability to remain free from injury will improve Outcome: Progressing   

## 2024-02-16 NOTE — Progress Notes (Addendum)
 PHARMACY - ANTICOAGULATION CONSULT NOTE  Pharmacy Consult for Heparin  Indication: hepatic vein thrombosis  Allergies  Allergen Reactions   Shellfish Allergy Diarrhea and Nausea And Vomiting   Sulfa Antibiotics Swelling and Rash    Hx of reaction to eye drops   Goat Milk Diarrhea and Nausea And Vomiting   Latex Rash    Patient Measurements: Height: 5' 5 (165.1 cm) Weight: 100.6 kg (221 lb 12.5 oz) IBW/kg (Calculated) : 57 HEPARIN  DW (KG): 81.2  Vital Signs: Temp: 97.9 F (36.6 C) (08/20 1130) Temp Source: Oral (08/20 1130) BP: 105/64 (08/20 1130) Pulse Rate: 85 (08/20 1130)  Labs: Recent Labs    02/15/24 1005 02/15/24 2136 02/16/24 0315  HGB 14.0  --  12.5  HCT 41.9  --  38.7  PLT 527*  --  423*  HEPARINUNFRC  --  0.34 0.38  CREATININE 0.86  --  0.75    Estimated Creatinine Clearance: 106.5 mL/min (by C-G formula based on SCr of 0.75 mg/dL).  Assessment: 43 YO F with PMH significant for anxiety, ADD, SOB, and obesity presents to the ED for abdominal pain and vomiting. Patient found to have hepatic vein thrombosis. Pharmacy consulted for IV heparin  for dosing.   Heparin  level remains therapeutic (0.38) on 1300 units/hr. CBC trended down but still okay. No bleeding reported.  Goal of Therapy:  Heparin  level 0.3-0.7 units/ml Monitor platelets by anticoagulation protocol: Yes   Plan:  Continue heparin  drip at 1300 units/hr Daily heparin  level and CBC. Noted plan for DOAC when nausea resolves and tolerating POs. No prescription insurance on file; could use 30-day free card for Eliquis  or Xarelto, and apply for patient assistance.   Genaro Zebedee Calin, RPh 02/16/2024,2:11 PM

## 2024-02-16 NOTE — Progress Notes (Signed)
 Progress Note   Patient: Leslie Holt FMW:[id number] DOB: [birth year] DOA: 02/15/2024  DOS: the patient was seen and examined on 02/16/2024   Brief hospital course:  43 y.o. female with medical history significant of GERD, anxiety, ADD, obesity presenting with abdominal pain with associated nausea vomiting and diarrhea.  Found to have portal vein thrombosis.   Assessment and Plan:  Portal Vein Thrombosis -several days abdominal pain with associated nausea vomiting and diarrhea. Initially CT appear to show no acute abnormality but on second look it was noted that patient had thrombus involving 2 branches of the portal vein, more subtle changes as this was a contrasted study but not a an angiogram.  Currently on heparin  drip.  IV fluids and prn pain meds on board.  Still having nausea malaise.  Etiology unclear however may be related to OCP.  Once able to tolerate PO, will transition to PO DOAC.  Advance diet as tolerated.    Anxiety -bupropion  and lexapro  on board.  GERD -PPI prn.   Subjective: Patient feeling improved this morning however still feeling nauseous.  Denies any fever, shortness of breath, chest pain with some no vomiting, no diarrhea this patient has not eaten.  Physical Exam:  Vitals:   02/15/24 1922 02/16/24 0044 02/16/24 0605 02/16/24 0735  BP: 126/65 112/60 110/64 (!) 111/51  Pulse: (!) 103 86 95 87  Resp: 20 17 18 17   Temp: 98.7 F (37.1 C) 97.8 F (36.6 C) 97.9 F (36.6 C) 97.9 F (36.6 C)  TempSrc: Oral Oral Oral Oral  SpO2: 97% 98% 96% 94%  Weight: 100.8 kg 100.6 kg    Height:        GENERAL:  Alert, pleasant, no acute distress  HEENT:  EOMI CARDIOVASCULAR:  RRR, no murmurs appreciated RESPIRATORY:  Clear to auscultation, no wheezing, rales, or rhonchi GASTROINTESTINAL:  Soft, nontender, nondistended EXTREMITIES:  No LE edema bilaterally NEURO:  No new focal deficits appreciated SKIN:  No rashes noted PSYCH:  Appropriate mood and affect     Data  Reviewed:  Imaging Studies: CT ABDOMEN PELVIS W CONTRAST Addendum Date: 02/15/2024 ADDENDUM REPORT: 02/15/2024 14:31 Electronically Signed   By: Lynwood Landy Raddle M.D.   On: 02/15/2024 14:31   Addendum Date: 02/15/2024 ADDENDUM REPORT: 02/15/2024 14:29 ADDENDUM: Upon further review, there does appear to be thrombus involving 2 branches of the right portal vein. Critical Value/emergent results were called by telephone at the time of interpretation on 02/15/2024 at 2:27 pm to provider [facility] , who verbally acknowledged these results. Electronically Signed   By: Lynwood Landy Raddle M.D.   On: 02/15/2024 14:29   Result Date: 02/15/2024 CLINICAL DATA:  Acute epigastric abdominal pain for 3 days. EXAM: CT ABDOMEN AND PELVIS WITH CONTRAST TECHNIQUE: Multidetector CT imaging of the abdomen and pelvis was performed using the standard protocol following bolus administration of intravenous contrast. RADIATION DOSE REDUCTION: This exam was performed according to the departmental dose-optimization program which includes automated exposure control, adjustment of the mA and/or kV according to patient size and/or use of iterative reconstruction technique. CONTRAST:  75mL OMNIPAQUE  IOHEXOL  350 MG/ML SOLN COMPARISON:  None Available. FINDINGS: Lower chest: Minimal right basilar subsegmental atelectasis is noted. Hepatobiliary: Hepatic steatosis. No cholelithiasis or biliary dilatation. Pancreas: Unremarkable. No pancreatic ductal dilatation or surrounding inflammatory changes. Spleen: Normal in size without focal abnormality. Adrenals/Urinary Tract: Adrenal glands are unremarkable. Kidneys are normal, without renal calculi, focal lesion, or hydronephrosis. Bladder is unremarkable. Stomach/Bowel: Stomach is within normal limits. Appendix appears  normal. No evidence of bowel wall thickening, distention, or inflammatory changes. Vascular/Lymphatic: No significant vascular findings are present. No enlarged abdominal or pelvic  lymph nodes. Reproductive: Uterus and bilateral adnexa are unremarkable. Other: No abdominal wall hernia or abnormality. No abdominopelvic ascites. Musculoskeletal: No acute or significant osseous findings. IMPRESSION: 1. Hepatic steatosis. 2. No acute abnormality seen in the abdomen or pelvis. Electronically Signed: By: Lynwood Landy Raddle M.D. On: 02/15/2024 12:40   US  Abdomen Limited RUQ (LIVER/GB) Result Date: 02/15/2024 EXAM: Right Upper Quadrant Abdominal Ultrasound 02/15/2024 02:12:24 PM TECHNIQUE: Real-time ultrasonography of the right upper quadrant of the abdomen was performed. COMPARISON: CT of earlier today. CLINICAL HISTORY: RUQ abdominal pain. FINDINGS: LIVER: The liver demonstrates mildly increased echogenicity. No intrahepatic biliary ductal dilatation. BILIARY SYSTEM: No pericholecystic fluid or wall thickening. No cholelithiasis. Negative sonographic Murphy's sign. Common bile duct is within normal limits measuring 2 mm. unremarkable. OTHER: No right upper quadrant ascites. IMPRESSION: 1. No acute findings. 2. Increased hepatic echogenicity, favoring steatosis. Electronically signed by: Rockey Kilts MD 02/15/2024 02:18 PM EDT RP Workstation: [id number]   DG Chest 2 View Result Date: 02/15/2024 CLINICAL DATA:  Midsternal chest pain. Epigastric pain for 3 days. Nausea and vomiting. EXAM: CHEST - 2 VIEW COMPARISON:  Radiographs 02/03/2024. FINDINGS: The heart size and mediastinal contours are stable. There are new linear opacities at the right lung base, most consistent with atelectasis. No confluent airspace disease, edema, pleural effusion or pneumothorax. The bones appear unremarkable. IMPRESSION: New linear opacities at the right lung base, most consistent with atelectasis. No evidence of pneumonia or edema. Electronically Signed   By: Elsie Perone M.D.   On: 02/15/2024 10:14   DG Chest 2 View Result Date: 02/03/2024 EXAM: 2 VIEW(S) XRAY OF THE CHEST 02/03/2024 09:08:54 AM COMPARISON: None  available. CLINICAL HISTORY: Cough for 2 months, no history of asthma or smoking. FINDINGS: LUNGS AND PLEURA: No focal pulmonary opacity. No pulmonary edema. No pleural effusion. No pneumothorax. HEART AND MEDIASTINUM: No acute abnormality of the cardiac and mediastinal silhouettes. BONES AND SOFT TISSUES: No acute osseous abnormality. IMPRESSION: 1. No acute findings or explanation for cough. Electronically signed by: Andrea Gasman MD 02/03/2024 03:33 PM EDT RP Workstation: [id number]    There are no new results to review at this time.  Previous records (including but not limited to H&P, progress notes, nursing notes, TOC management) were reviewed in assessment of this patient.  Labs: CBC: Recent Labs  Lab 02/15/24 1005 02/16/24 0315  WBC 18.7* 16.6*  HGB 14.0 12.5  HCT 41.9 38.7  MCV 89.0 92.4  PLT 527* 423*   Basic Metabolic Panel: Recent Labs  Lab 02/15/24 1005 02/16/24 0315  NA 136 136  K 4.4 4.1  CL 102 101  CO2 25 23  GLUCOSE 123* 108*  BUN 16 9  CREATININE 0.86 0.75  CALCIUM 9.1 8.7*   Liver Function Tests: Recent Labs  Lab 02/15/24 1005 02/16/24 0315  AST 15 12*  ALT 20 17  ALKPHOS 89 74  BILITOT 0.9 0.7  PROT 6.8 5.9*  ALBUMIN 3.4* 2.9*   CBG: No results for input(s): GLUCAP in the last 168 hours.  Scheduled Meds:  buPROPion   300 mg Oral Daily   escitalopram   10 mg Oral Daily   sodium chloride  flush  3 mL Intravenous Q12H   Continuous Infusions:  heparin  1,300 Units/hr (02/16/24 0609)   PRN Meds:.acetaminophen  **OR** acetaminophen , HYDROcodone -acetaminophen , HYDROmorphone  (DILAUDID ) injection, ondansetron  (ZOFRAN ) IV, polyethylene glycol  Family Communication: None at bedside  Disposition: Status is: Observation The patient remains OBS appropriate and will d/c before 2 midnights.     Time spent: 36 minutes  Length of inpatient stay: 0 days  Author: Carliss LELON Canales, DO 02/16/2024 10:16 AM  For on call review [email or web address].

## 2024-02-17 ENCOUNTER — Other Ambulatory Visit (HOSPITAL_COMMUNITY): Payer: Self-pay

## 2024-02-17 ENCOUNTER — Telehealth (HOSPITAL_COMMUNITY): Payer: Self-pay | Admitting: Pharmacy Technician

## 2024-02-17 ENCOUNTER — Encounter (HOSPITAL_COMMUNITY): Payer: Self-pay | Admitting: Internal Medicine

## 2024-02-17 DIAGNOSIS — I81 Portal vein thrombosis: Secondary | ICD-10-CM | POA: Diagnosis not present

## 2024-02-17 LAB — COMPREHENSIVE METABOLIC PANEL WITH GFR
ALT: 22 U/L (ref 0–44)
AST: 18 U/L (ref 15–41)
Albumin: 2.9 g/dL — ABNORMAL LOW (ref 3.5–5.0)
Alkaline Phosphatase: 82 U/L (ref 38–126)
Anion gap: 9 (ref 5–15)
BUN: 10 mg/dL (ref 6–20)
CO2: 25 mmol/L (ref 22–32)
Calcium: 8.7 mg/dL — ABNORMAL LOW (ref 8.9–10.3)
Chloride: 103 mmol/L (ref 98–111)
Creatinine, Ser: 0.89 mg/dL (ref 0.44–1.00)
GFR, Estimated: >60 mL/min (ref >60)
Glucose, Bld: 110 mg/dL — ABNORMAL HIGH (ref 70–99)
Potassium: 4 mmol/L (ref 3.5–5.1)
Sodium: 137 mmol/L (ref 135–145)
Total Bilirubin: 0.5 mg/dL (ref 0.0–1.2)
Total Protein: 6.1 g/dL — ABNORMAL LOW (ref 6.5–8.1)

## 2024-02-17 LAB — CBC
HCT: 36.8 % (ref 36.0–46.0)
Hemoglobin: 12.2 g/dL (ref 12.0–15.0)
MCH: 29.7 pg (ref 26.0–34.0)
MCHC: 33.2 g/dL (ref 30.0–36.0)
MCV: 89.5 fL (ref 80.0–100.0)
Platelets: 504 K/uL — ABNORMAL HIGH (ref 150–400)
RBC: 4.11 MIL/uL (ref 3.87–5.11)
RDW: 14 % (ref 11.5–15.5)
WBC: 14.8 K/uL — ABNORMAL HIGH (ref 4.0–10.5)
nRBC: 0 % (ref 0.0–0.2)

## 2024-02-17 LAB — HEPARIN LEVEL (UNFRACTIONATED): Heparin Unfractionated: 0.32 [IU]/mL (ref 0.30–0.70)

## 2024-02-17 MED ORDER — APIXABAN 5 MG PO TABS
10.0000 mg | ORAL_TABLET | Freq: Two times a day (BID) | ORAL | Status: DC
  Administered 2024-02-17: 10 mg via ORAL
  Filled 2024-02-17: qty 2

## 2024-02-17 MED ORDER — APIXABAN 5 MG PO TABS: 10.0000 mg | ORAL_TABLET | Freq: Two times a day (BID) | ORAL | Status: DC

## 2024-02-17 MED ORDER — APIXABAN 5 MG PO TABS: 5.0000 mg | ORAL_TABLET | Freq: Two times a day (BID) | ORAL | Status: DC

## 2024-02-17 MED ORDER — APIXABAN (ELIQUIS) VTE STARTER PACK (10MG AND 5MG)
ORAL_TABLET | ORAL | 0 refills | Status: AC
  Filled 2024-02-17: qty 74, 30d supply, fill #0

## 2024-02-17 NOTE — Progress Notes (Addendum)
 AVS reviewed.  New Med education completed by Harlene Scientist, research (physical sciences)).  2 PIV removed.  VSS. Tele removed. And called in. Patient has belongings.  RIDE waiting at Main Entrance A.

## 2024-02-17 NOTE — TOC Transition Note (Signed)
 Transition of Care [facility]) - Discharge Note   Patient Details  Name: Markia Kyer MRN: [id number] Date of Birth: [birth year]  [facility] CM/SW Contact:  Waddell Barnie Rama, RN Phone Number: 02/17/2024, 10:09 AM   Clinical Narrative:    For dc today, TOC to fill eliquis  for 30 day free, she has transport home.  Per [facility] pharmacy her refill copay for eliquis  will be zero dollars.          Patient Goals and CMS Choice            Discharge Placement                       Discharge Plan and Services Additional resources added to the After Visit Summary for                                       Social Drivers of Health (SDOH) Interventions SDOH Screenings   Food Insecurity: No Food Insecurity (02/15/2024)  Housing: Low Risk  (02/15/2024)  Transportation Needs: No Transportation Needs (02/15/2024)  Utilities: Not At Risk (02/15/2024)  Social Connections: Unknown (11/11/2021)   Received from [facility]  Tobacco Use: Low Risk  (02/17/2024)     Readmission Risk Interventions     No data to display

## 2024-02-17 NOTE — Plan of Care (Signed)
   Problem: Safety: Goal: Ability to remain free from injury will improve Outcome: Progressing   Problem: Skin Integrity: Goal: Risk for impaired skin integrity will decrease Outcome: Progressing

## 2024-02-17 NOTE — Discharge Summary (Signed)
 Physician Discharge Summary   Patient: Leslie Holt MRN: [id number] DOB: Aug 17, 1980  Admit date:     02/15/2024  Discharge date: 02/17/24  Discharge Physician: Carliss LELON Canales   PCP: Barbra Odor, NP   Recommendations at discharge:    Pt to be discharged home.   If you experience worsening fever, chills, chest pain, shortness of breath, or other concerning symptoms, please call your PCP or go to the emergency department immediately.  Discharge Diagnoses: Principal Problem:   Portal vein thrombosis Active Problems:   Generalized obesity   Gastroesophageal reflux disease   Anxiety   Attention deficit disorder  Resolved Problems:   * No resolved hospital problems. *   Hospital Course:  43 y.o. female with medical history significant of GERD, anxiety, ADD, obesity presenting with abdominal pain with associated nausea vomiting and diarrhea.  Found to have portal vein thrombosis.    Assessment and Plan:   Portal Vein Thrombosis -several days abdominal pain with associated nausea vomiting and diarrhea. Initially CT appear to show no acute abnormality but on second look it was noted that patient had thrombus involving 2 branches of the portal vein, more subtle changes as this was a contrasted study but not a an angiogram.  Received empiric heparin  drip.  IV fluids and prn pain meds on board.  Etiology unclear however may be related to OCP.  Resolution of nausea on vomiting with abdominal pain.  Will transition patient over to p.o. Eliquis  starter pack to take as directed, 10 mg twice daily x 7 days then 5 mg twice daily thereafter.  Recommend patient follow-up with hematology in the outpatient setting for ongoing workup and to discuss duration of anticoagulation.   Anxiety -bupropion  and lexapro  on board.   GERD -PPI prn.   Consultants: None Procedures performed: None Disposition: Home Diet recommendation:  Discharge Diet Orders (From admission, onward)     Start     Ordered    02/17/24 0000  Diet - low sodium heart healthy        02/17/24 0939           Cardiac diet  DISCHARGE MEDICATION: Allergies as of 02/17/2024       Reactions   Shellfish Allergy Diarrhea, Nausea And Vomiting   Sulfa Antibiotics Swelling, Rash   Hx of reaction to eye drops   Goat Milk Diarrhea, Nausea And Vomiting   Latex Rash        Medication List     TAKE these medications    apixaban  5 MG Tabs tablet Commonly known as: ELIQUIS  Take 2 tablets (10 mg total) by mouth 2 (two) times daily for 7 days, THEN 1 tablet (5 mg total) 2 (two) times daily. Start taking on: February 17, 2024   buPROPion  300 MG 24 hr tablet Commonly known as: WELLBUTRIN  XL Take 300 mg by mouth daily.   cetirizine 10 MG tablet Commonly known as: ZYRTEC Take 10 mg by mouth daily.   escitalopram  10 MG tablet Commonly known as: LEXAPRO  Take 10 mg by mouth daily.   Norgestimate-Ethinyl Estradiol Triphasic 0.18/0.215/0.25 MG-25 MCG tab Take 1 tablet by mouth daily.   predniSONE  10 MG (48) Tbpk tablet Commonly known as: STERAPRED UNI-PAK 48 TAB Take by mouth as directed.   ProAir RespiClick 108 (90 Base) MCG/ACT Aepb Generic drug: Albuterol Sulfate Inhale 2 puffs into the lungs 3 (three) times daily as needed (cough).         Discharge Exam: Filed Weights   02/15/24 1922 02/16/24  0044 02/17/24 0524  Weight: 100.8 kg 100.6 kg 100.1 kg    GENERAL:  Alert, pleasant, no acute distress  HEENT:  EOMI CARDIOVASCULAR:  RRR, no murmurs appreciated RESPIRATORY:  Clear to auscultation, no wheezing, rales, or rhonchi GASTROINTESTINAL:  Soft, nontender, nondistended EXTREMITIES:  No LE edema bilaterally NEURO:  No new focal deficits appreciated SKIN:  No rashes noted PSYCH:  Appropriate mood and affect     Condition at discharge: improving  The results of significant diagnostics from this hospitalization (including imaging, microbiology, ancillary and laboratory) are listed below for  reference.   Imaging Studies: CT ABDOMEN PELVIS W CONTRAST Addendum Date: 02/15/2024 ADDENDUM REPORT: 02/15/2024 14:31 Electronically Signed   By: Lynwood Landy Raddle M.D.   On: 02/15/2024 14:31   Addendum Date: 02/15/2024 ADDENDUM REPORT: 02/15/2024 14:29 ADDENDUM: Upon further review, there does appear to be thrombus involving 2 branches of the right portal vein. Critical Value/emergent results were called by telephone at the time of interpretation on 02/15/2024 at 2:27 pm to provider [facility] , who verbally acknowledged these results. Electronically Signed   By: Lynwood Landy Raddle M.D.   On: 02/15/2024 14:29   Result Date: 02/15/2024 CLINICAL DATA:  Acute epigastric abdominal pain for 3 days. EXAM: CT ABDOMEN AND PELVIS WITH CONTRAST TECHNIQUE: Multidetector CT imaging of the abdomen and pelvis was performed using the standard protocol following bolus administration of intravenous contrast. RADIATION DOSE REDUCTION: This exam was performed according to the departmental dose-optimization program which includes automated exposure control, adjustment of the mA and/or kV according to patient size and/or use of iterative reconstruction technique. CONTRAST:  75mL OMNIPAQUE  IOHEXOL  350 MG/ML SOLN COMPARISON:  None Available. FINDINGS: Lower chest: Minimal right basilar subsegmental atelectasis is noted. Hepatobiliary: Hepatic steatosis. No cholelithiasis or biliary dilatation. Pancreas: Unremarkable. No pancreatic ductal dilatation or surrounding inflammatory changes. Spleen: Normal in size without focal abnormality. Adrenals/Urinary Tract: Adrenal glands are unremarkable. Kidneys are normal, without renal calculi, focal lesion, or hydronephrosis. Bladder is unremarkable. Stomach/Bowel: Stomach is within normal limits. Appendix appears normal. No evidence of bowel wall thickening, distention, or inflammatory changes. Vascular/Lymphatic: No significant vascular findings are present. No enlarged abdominal or pelvic  lymph nodes. Reproductive: Uterus and bilateral adnexa are unremarkable. Other: No abdominal wall hernia or abnormality. No abdominopelvic ascites. Musculoskeletal: No acute or significant osseous findings. IMPRESSION: 1. Hepatic steatosis. 2. No acute abnormality seen in the abdomen or pelvis. Electronically Signed: By: Lynwood Landy Raddle M.D. On: 02/15/2024 12:40   US  Abdomen Limited RUQ (LIVER/GB) Result Date: 02/15/2024 EXAM: Right Upper Quadrant Abdominal Ultrasound 02/15/2024 02:12:24 PM TECHNIQUE: Real-time ultrasonography of the right upper quadrant of the abdomen was performed. COMPARISON: CT of earlier today. CLINICAL HISTORY: RUQ abdominal pain. FINDINGS: LIVER: The liver demonstrates mildly increased echogenicity. No intrahepatic biliary ductal dilatation. BILIARY SYSTEM: No pericholecystic fluid or wall thickening. No cholelithiasis. Negative sonographic Murphy's sign. Common bile duct is within normal limits measuring 2 mm. unremarkable. OTHER: No right upper quadrant ascites. IMPRESSION: 1. No acute findings. 2. Increased hepatic echogenicity, favoring steatosis. Electronically signed by: Rockey Kilts MD 02/15/2024 02:18 PM EDT RP Workstation: [id number]   DG Chest 2 View Result Date: 02/15/2024 CLINICAL DATA:  Midsternal chest pain. Epigastric pain for 3 days. Nausea and vomiting. EXAM: CHEST - 2 VIEW COMPARISON:  Radiographs 02/03/2024. FINDINGS: The heart size and mediastinal contours are stable. There are new linear opacities at the right lung base, most consistent with atelectasis. No confluent airspace disease, edema, pleural effusion or pneumothorax. The  bones appear unremarkable. IMPRESSION: New linear opacities at the right lung base, most consistent with atelectasis. No evidence of pneumonia or edema. Electronically Signed   By: Elsie Perone M.D.   On: 02/15/2024 10:14   DG Chest 2 View Result Date: 02/03/2024 EXAM: 2 VIEW(S) XRAY OF THE CHEST 02/03/2024 09:08:54 AM COMPARISON: None  available. CLINICAL HISTORY: Cough for 2 months, no history of asthma or smoking. FINDINGS: LUNGS AND PLEURA: No focal pulmonary opacity. No pulmonary edema. No pleural effusion. No pneumothorax. HEART AND MEDIASTINUM: No acute abnormality of the cardiac and mediastinal silhouettes. BONES AND SOFT TISSUES: No acute osseous abnormality. IMPRESSION: 1. No acute findings or explanation for cough. Electronically signed by: Andrea Gasman MD 02/03/2024 03:33 PM EDT RP Workstation: [id number]    Microbiology: Results for orders placed or performed during the hospital encounter of 07/16/23  Culture, group A strep     Status: None   Collection Time: 07/16/23  5:18 PM   Specimen: Throat  Result Value Ref Range Status   Specimen Description THROAT  Final   Special Requests NONE  Final   Culture   Final    NO GROUP A STREP (S.PYOGENES) ISOLATED Performed at [facility], [address], [place], [place] [address]    Report Status 07/19/2023 FINAL  Final    Labs: CBC: Recent Labs  Lab 02/15/24 1005 02/16/24 0315 02/17/24 0303  WBC 18.7* 16.6* 14.8*  HGB 14.0 12.5 12.2  HCT 41.9 38.7 36.8  MCV 89.0 92.4 89.5  PLT 527* 423* 504*   Basic Metabolic Panel: Recent Labs  Lab 02/15/24 1005 02/16/24 0315 02/17/24 0303  NA 136 136 137  K 4.4 4.1 4.0  CL 102 101 103  CO2 25 23 25   GLUCOSE 123* 108* 110*  BUN 16 9 10   CREATININE 0.86 0.75 0.89  CALCIUM 9.1 8.7* 8.7*   Liver Function Tests: Recent Labs  Lab 02/15/24 1005 02/16/24 0315 02/17/24 0303  AST 15 12* 18  ALT 20 17 22   ALKPHOS 89 74 82  BILITOT 0.9 0.7 0.5  PROT 6.8 5.9* 6.1*  ALBUMIN 3.4* 2.9* 2.9*   CBG: No results for input(s): GLUCAP in the last 168 hours.  Discharge time spent: 25 minutes.  Length of inpatient stay: 0 days  Signed: Carliss LELON Canales, DO [facility] 02/17/2024

## 2024-02-17 NOTE — Telephone Encounter (Signed)
 Patient Product/process development scientist completed.    The patient is insured through [employer]. Patient has [employer], may use a copay card, and/or apply for patient assistance if available.    Ran test claim for Eliquis 5 mg and the current 30 day co-pay is $0.00.   This test claim was processed through [facility]- copay amounts may vary at other pharmacies due to pharmacy/plan contracts, or as the patient moves through the different stages of their insurance plan.     Morgan Arab, CPHT Pharmacy Technician III Certified Patient Advocate [facility] Patient Advocate Team Direct Number: [phone]  Fax: [phone]

## 2024-02-17 NOTE — Discharge Instructions (Addendum)
 Information on my medicine - ELIQUIS  (apixaban )  This medication education was reviewed with me or my healthcare representative as part of my discharge preparation.  The pharmacist that spoke with me during my hospital stay was:  Amos Micheals, Harlene BROCKS, [facility]  Why was Eliquis  prescribed for you? Eliquis  was prescribed to treat blood clots that may have been found in the veins of your legs (deep vein thrombosis) or in your lungs (pulmonary embolism) and to reduce the risk of them occurring again.  What do You need to know about Eliquis  ? The starting dose is 10 mg (two 5 mg tablets) taken TWICE daily for the FIRST SEVEN (7) DAYS, then on (enter date)  02/24/24  the dose is reduced to ONE 5 mg tablet taken TWICE daily.  Eliquis  may be taken with or without food.   Try to take the dose about the same time in the morning and in the evening. If you have difficulty swallowing the tablet whole please discuss with your pharmacist how to take the medication safely.  Take Eliquis  exactly as prescribed and DO NOT stop taking Eliquis  without talking to the doctor who prescribed the medication.  Stopping may increase your risk of developing a new blood clot.  Refill your prescription before you run out.  After discharge, you should have regular check-up appointments with your healthcare provider that is prescribing your Eliquis .    What do you do if you miss a dose? If a dose of ELIQUIS  is not taken at the scheduled time, take it as soon as possible on the same day and twice-daily administration should be resumed. The dose should not be doubled to make up for a missed dose.  Important Safety Information A possible side effect of Eliquis  is bleeding. You should call your healthcare provider right away if you experience any of the following: Bleeding from an injury or your nose that does not stop. Unusual colored urine (red or dark brown) or unusual colored stools (red or black). Unusual bruising for  unknown reasons. A serious fall or if you hit your head (even if there is no bleeding).  Some medicines may interact with Eliquis  and might increase your risk of bleeding or clotting while on Eliquis . To help avoid this, consult your healthcare provider or pharmacist prior to using any new prescription or non-prescription medications, including herbals, vitamins, non-steroidal anti-inflammatory drugs (NSAIDs) and supplements.  This website has more information on Eliquis  (apixaban ): [email or web address] .[email or web address] dena

## 2024-04-18 ENCOUNTER — Inpatient Hospital Stay

## 2024-04-18 ENCOUNTER — Inpatient Hospital Stay: Attending: Oncology | Admitting: Oncology

## 2024-04-18 ENCOUNTER — Encounter: Payer: Self-pay | Admitting: Oncology

## 2024-04-18 VITALS — BP 125/78 | HR 76 | Temp 98.9°F | Resp 18 | Ht 65.0 in | Wt 233.2 lb

## 2024-04-18 DIAGNOSIS — J9811 Atelectasis: Secondary | ICD-10-CM | POA: Insufficient documentation

## 2024-04-18 DIAGNOSIS — Z90411 Acquired partial absence of pancreas: Secondary | ICD-10-CM | POA: Insufficient documentation

## 2024-04-18 DIAGNOSIS — F32A Depression, unspecified: Secondary | ICD-10-CM

## 2024-04-18 DIAGNOSIS — I81 Portal vein thrombosis: Secondary | ICD-10-CM | POA: Diagnosis present

## 2024-04-18 DIAGNOSIS — Z9081 Acquired absence of spleen: Secondary | ICD-10-CM | POA: Diagnosis not present

## 2024-04-18 DIAGNOSIS — Z91011 Allergy to milk products, unspecified: Secondary | ICD-10-CM | POA: Insufficient documentation

## 2024-04-18 DIAGNOSIS — K219 Gastro-esophageal reflux disease without esophagitis: Secondary | ICD-10-CM

## 2024-04-18 DIAGNOSIS — Z811 Family history of alcohol abuse and dependence: Secondary | ICD-10-CM | POA: Diagnosis not present

## 2024-04-18 DIAGNOSIS — Z91040 Latex allergy status: Secondary | ICD-10-CM | POA: Diagnosis not present

## 2024-04-18 DIAGNOSIS — A059 Bacterial foodborne intoxication, unspecified: Secondary | ICD-10-CM | POA: Diagnosis not present

## 2024-04-18 DIAGNOSIS — F419 Anxiety disorder, unspecified: Secondary | ICD-10-CM

## 2024-04-18 DIAGNOSIS — Z882 Allergy status to sulfonamides status: Secondary | ICD-10-CM | POA: Diagnosis not present

## 2024-04-18 DIAGNOSIS — Z8249 Family history of ischemic heart disease and other diseases of the circulatory system: Secondary | ICD-10-CM

## 2024-04-18 DIAGNOSIS — Z9089 Acquired absence of other organs: Secondary | ICD-10-CM | POA: Diagnosis not present

## 2024-04-18 DIAGNOSIS — K760 Fatty (change of) liver, not elsewhere classified: Secondary | ICD-10-CM | POA: Diagnosis not present

## 2024-04-18 DIAGNOSIS — J45909 Unspecified asthma, uncomplicated: Secondary | ICD-10-CM | POA: Diagnosis not present

## 2024-04-18 DIAGNOSIS — R072 Precordial pain: Secondary | ICD-10-CM | POA: Insufficient documentation

## 2024-04-18 DIAGNOSIS — I728 Aneurysm of other specified arteries: Secondary | ICD-10-CM

## 2024-04-18 DIAGNOSIS — D75839 Thrombocytosis, unspecified: Secondary | ICD-10-CM | POA: Diagnosis not present

## 2024-04-18 DIAGNOSIS — Z818 Family history of other mental and behavioral disorders: Secondary | ICD-10-CM | POA: Insufficient documentation

## 2024-04-18 DIAGNOSIS — Z79899 Other long term (current) drug therapy: Secondary | ICD-10-CM | POA: Insufficient documentation

## 2024-04-18 DIAGNOSIS — Z793 Long term (current) use of hormonal contraceptives: Secondary | ICD-10-CM | POA: Insufficient documentation

## 2024-04-18 DIAGNOSIS — Z7901 Long term (current) use of anticoagulants: Secondary | ICD-10-CM | POA: Insufficient documentation

## 2024-04-18 DIAGNOSIS — R112 Nausea with vomiting, unspecified: Secondary | ICD-10-CM | POA: Insufficient documentation

## 2024-04-18 DIAGNOSIS — Z8349 Family history of other endocrine, nutritional and metabolic diseases: Secondary | ICD-10-CM | POA: Diagnosis not present

## 2024-04-18 DIAGNOSIS — Z823 Family history of stroke: Secondary | ICD-10-CM | POA: Insufficient documentation

## 2024-04-18 DIAGNOSIS — Z8673 Personal history of transient ischemic attack (TIA), and cerebral infarction without residual deficits: Secondary | ICD-10-CM | POA: Diagnosis not present

## 2024-04-18 DIAGNOSIS — F909 Attention-deficit hyperactivity disorder, unspecified type: Secondary | ICD-10-CM

## 2024-04-18 LAB — CMP (CANCER CENTER ONLY)
ALT: 15 U/L (ref 0–44)
AST: 15 U/L (ref 15–41)
Albumin: 4.2 g/dL (ref 3.5–5.0)
Alkaline Phosphatase: 125 U/L (ref 38–126)
Anion gap: 12 (ref 5–15)
BUN: 14 mg/dL (ref 6–20)
CO2: 26 mmol/L (ref 22–32)
Calcium: 9.9 mg/dL (ref 8.9–10.3)
Chloride: 102 mmol/L (ref 98–111)
Creatinine: 0.75 mg/dL (ref 0.44–1.00)
GFR, Estimated: >60 mL/min (ref >60)
Glucose, Bld: 98 mg/dL (ref 70–99)
Potassium: 4.5 mmol/L (ref 3.5–5.1)
Sodium: 139 mmol/L (ref 135–145)
Total Bilirubin: 0.3 mg/dL (ref 0.0–1.2)
Total Protein: 7.4 g/dL (ref 6.5–8.1)

## 2024-04-18 LAB — CBC WITH DIFFERENTIAL (CANCER CENTER ONLY)
Abs Immature Granulocytes: 0.05 K/uL (ref 0.00–0.07)
Basophils Absolute: 0.1 K/uL (ref 0.0–0.1)
Basophils Relative: 1 %
Eosinophils Absolute: 0.1 K/uL (ref 0.0–0.5)
Eosinophils Relative: 1 %
HCT: 40.2 % (ref 36.0–46.0)
Hemoglobin: 13.1 g/dL (ref 12.0–15.0)
Immature Granulocytes: 0 %
Lymphocytes Relative: 20 %
Lymphs Abs: 2.8 K/uL (ref 0.7–4.0)
MCH: 29.4 pg (ref 26.0–34.0)
MCHC: 32.6 g/dL (ref 30.0–36.0)
MCV: 90.3 fL (ref 80.0–100.0)
Monocytes Absolute: 0.8 K/uL (ref 0.1–1.0)
Monocytes Relative: 6 %
Neutro Abs: 10.1 K/uL — ABNORMAL HIGH (ref 1.7–7.7)
Neutrophils Relative %: 72 %
Platelet Count: 579 K/uL — ABNORMAL HIGH (ref 150–400)
RBC: 4.45 MIL/uL (ref 3.87–5.11)
RDW: 14.1 % (ref 11.5–15.5)
WBC Count: 14 K/uL — ABNORMAL HIGH (ref 4.0–10.5)
nRBC: 0 % (ref 0.0–0.2)

## 2024-04-18 LAB — D-DIMER, QUANTITATIVE: D-Dimer, Quant: <0.27 ug{FEU}/mL (ref 0.00–0.50)

## 2024-04-18 NOTE — Progress Notes (Signed)
 [facility]  HEMATOLOGY CLINIC CONSULTATION NOTE   PATIENT NAME: Leslie Holt   MR#: [id number] DOB: 08/26/1980  DATE OF SERVICE: 04/18/2024   REFERRING PROVIDER  Hospital follow up  Patient Care Team: Leslie Odor, NP as PCP - General (Nurse Practitioner)   REASON FOR CONSULTATION/ CHIEF COMPLAINT:  Portal vein thrombosis diagnosed in August 2025  ASSESSMENT & PLAN:  Leslie Holt is a 43 y.o. lady with a past medical history of attention deficit disorder, anxiety, depression, asthma, GERD, was referred to our service for evaluation after she was diagnosed with portal vein thrombosis in August 2025.    Portal vein thrombosis Portal vein thrombosis confirmed via CT scan in August 2025, with clot involving two branches of the right portal vein.  Suspected etiology is estrogen-containing oral contraceptives, though further investigation is needed to rule out other causes. Currently on Eliquis  5 mg twice daily, which has resolved her pain.  - Continue Eliquis  5 mg twice daily  - Order blood tests for thrombophilia workup, including factor V Leiden mutation, prothrombin gene mutation, beta-2 glycoprotein antibodies, and anticardiolipin antibodies.  Rest of the thrombophilia workup will be deferred as it can be falsely abnormal given recent thrombosis and current use of Eliquis .  - Schedule follow-up scan before Thanksgiving to assess clot resolution  - Consider switching to aspirin or prophylactic dosing of blood thinner after scan results  She was advised to avoid estrogen containing birth control pills and switch to progestin only medication.  She will discuss this further with her PCP.  Current use of estrogen-containing oral contraceptives and need for alternative contraception Current use of estrogen-containing oral contraceptives is suspected to be a contributing factor to the portal vein thrombosis. She has taken her last dose and is advised to switch  to a progesterone-only contraceptive to minimize the risk of future thrombotic events. 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 Platelet counts elevated, ranging from 423,000 to 527,000. White blood cell counts elevated, between 14,000 and 18,000.  Splenic artery aneurysm, status post repair and postsplenectomy status Splenic artery aneurysm repair and postsplenectomy status from 19 years ago, following a life-threatening rupture during pregnancy. This history contributes to her current thrombocytosis and leukocytosis.  Travel Considerations Inquired about the safety of flying while on blood thinners. Advised that flying is safe with general precautions such as hydration and leg exercises during the flight. - Ensure hydration and perform leg exercises during flight  I reviewed lab results and outside records for this visit and discussed relevant results with the patient. Diagnosis, plan of care and treatment options were also discussed in detail with the patient. Opportunity provided to ask questions and answers provided to her apparent satisfaction. Provided instructions to call our clinic with any problems, questions or concerns prior to return visit. I recommended to continue follow-up with PCP and sub-specialists. She verbalized understanding and agreed with the plan. No barriers to learning was detected.  Leslie Patten, MD  04/18/2024 9:52 PM  [name] - A DEPT OF Leslie Holt. [facility] [id number] [place] [address] Dept: [phone] Dept Fax: [phone]   HISTORY OF PRESENT ILLNESS:  Discussed the use of AI scribe software for clinical note transcription with the patient, who gave  verbal consent to proceed.  [name] is a 43 year old female who presents for hematology consultation following a hospitalization for portal vein thrombosis.  In August 2025, she was hospitalized for portal vein thrombosis after experiencing abdominal pain, nausea, and diarrhea for three to four days. Initially attributing these symptoms to food poisoning, she sought medical attention when she persisted. Imaging studies during her hospital stay included an ultrasound, which the patient recalls as identifying the blood clots, and a CT scan, for which an addendum was later made to the report. She was discharged on Eliquis  5 mg twice daily, and her symptoms improved approximately 24 hours after admission with no recurrence of pain since then.  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 This emergency surgery resulted in the premature birth of her son, who has cerebral palsy due to anoxic injury. She has a history of high platelet counts, with recent values ranging from 423,000 to 527,000, and elevated white blood cell counts, with recent values between 14,000 and 18,000.  Family history is notable for her mother having a stroke at age 64, with no other family history of blood clotting disorders. She has been on estrogen-containing birth control pills, which are suspected to have contributed to her clotting issue. She took her last dose today and is considering switching to a progesterone-only formulation.  She does not smoke and has abstained from alcohol since starting blood thinners. She has a history of allergies for which she uses an inhaler and is currently receiving allergy shots. No current chest pain or breathing difficulties. She was in the [employer] but left after her son was born to care for him due to his disability.   MEDICAL HISTORY Past Medical History:   Diagnosis Date   ADD (attention deficit disorder)    Anxiety    Arthritis    Asthma    Depression    Heartburn      SURGICAL HISTORY Past Surgical History:  Procedure Laterality Date   CESAREAN SECTION  2016   PANCREAS SURGERY  01/08/2005   SPLENECTOMY, TOTAL  01/08/2005   TONSILLECTOMY  04/2014   TYMPANOPLASTY  1996   TYMPANOSTOMY TUBE PLACEMENT  1987     SOCIAL HISTORY: She reports that she has never smoked. She has never used smokeless tobacco. She reports that she does not currently use alcohol. She reports that she does not use drugs. Social History   Socioeconomic History   Marital status: Single    Spouse name: Not on file   Number of children: Not on file   Years of education: Not on file   Highest education level: Not on file  Occupational History   Not on file  Tobacco Use   Smoking status: Never   Smokeless tobacco: Never  Vaping Use   Vaping status: Never Used  Substance and Sexual Activity   Alcohol use: Not Currently    Comment: occassional   Drug use: Never   Sexual activity: Not on file  Other Topics Concern   Not on file  Social History Narrative   Not on file   Social Drivers of Health   Financial Resource Strain: Not on file  Food Insecurity: No Food Insecurity (04/18/2024)   Hunger Vital Sign    Worried About Running Out of Food in the Last Year: Never true    Ran Out of Food in the Last Year: Never true  Transportation Needs: No Transportation Needs (04/18/2024)  PRAPARE - Administrator, Civil Service (Medical): No    Lack of Transportation (Non-Medical): No  Physical Activity: Not on file  Stress: Not on file  Social Connections: Unknown (11/11/2021)   Received from [facility]   Social Network    Social Network: Not on file  Intimate Partner Violence: Not At Risk (04/18/2024)   Humiliation, Afraid, Rape, and Kick questionnaire    Fear of Current or Ex-Partner: No    Emotionally Abused: No    Physically Abused:  No    Sexually Abused: No    FAMILY HISTORY: Her family history includes Alcoholism in her father; Anxiety disorder in her mother; Depression in her mother; High blood pressure in her father; Obesity in her mother; Stroke in her mother.  CURRENT MEDICATIONS   Current Outpatient Medications  Medication Instructions   APIXABAN  (ELIQUIS ) VTE STARTER PACK (10MG  AND 5MG ) Take 10 mg by mouth 2 (two) times daily for 7 days, THEN 5 mg 2 (two) times daily.   buPROPion  (WELLBUTRIN  XL) 300 mg, Daily   cetirizine (ZYRTEC) 10 mg, Daily   escitalopram  (LEXAPRO ) 10 mg, Daily   Norgestimate-Ethinyl Estradiol Triphasic 0.18/0.215/0.25 MG-25 MCG tab 1 tablet, Daily   PROAIR RESPICLICK 108 (90 Base) MCG/ACT AEPB 2 puffs, 3 times daily PRN   Triamcinolone Acetonide (NASACORT ALLERGY 24HR NA) 1 spray, 2 times daily     ALLERGIES  She is allergic to shellfish allergy, sulfa antibiotics, goat milk, and latex.  REVIEW OF SYSTEMS:  Review of Systems - Oncology   Rest of the pertinent review of systems is unremarkable except as mentioned above in HPI.  PHYSICAL EXAMINATION:   Onc Performance Status - 04/18/24 1057       ECOG Perf Status   ECOG Perf Status Fully active, able to carry on all pre-disease performance without restriction      KPS SCALE   KPS % SCORE Normal, no compliants, no evidence of disease          Vitals:   04/18/24 1050  BP: 125/78  Pulse: 76  Resp: 18  Temp: 98.9 F (37.2 C)  SpO2: 99%   Filed Weights   04/18/24 1050  Weight: 233 lb 3.2 oz (105.8 kg)    Physical Exam Constitutional:      General: She is not in acute distress.    Appearance: Normal appearance.  HENT:     Head: Normocephalic and atraumatic.  Cardiovascular:     Rate and Rhythm: Normal rate.     Heart sounds: Normal heart sounds.  Pulmonary:     Effort: Pulmonary effort is normal. No respiratory distress.     Breath sounds: Normal breath sounds.  Abdominal:     General: There is no  distension.     Comments: Midline vertical scar well-healed  Neurological:     General: No focal deficit present.     Mental Status: She is alert and oriented to person, place, and time.  Psychiatric:        Mood and Affect: Mood normal.        Behavior: Behavior normal.      LABORATORY DATA:   I have reviewed the data as listed.  Results for orders placed or performed in visit on 04/18/24  D-dimer, quantitative  Result Value Ref Range   D-Dimer, Quant <0.27 0.00 - 0.50 ug/mL-FEU  CMP (Cancer Center only)  Result Value Ref Range   Sodium 139 135 - 145 mmol/L   Potassium 4.5 3.5 -  5.1 mmol/L   Chloride 102 98 - 111 mmol/L   CO2 26 22 - 32 mmol/L   Glucose, Bld 98 70 - 99 mg/dL   BUN 14 6 - 20 mg/dL   Creatinine 9.24 9.55 - 1.00 mg/dL   Calcium 9.9 8.9 - 89.6 mg/dL   Total Protein 7.4 6.5 - 8.1 g/dL   Albumin 4.2 3.5 - 5.0 g/dL   AST 15 15 - 41 U/L   ALT 15 0 - 44 U/L   Alkaline Phosphatase 125 38 - 126 U/L   Total Bilirubin 0.3 0.0 - 1.2 mg/dL   GFR, Estimated >39 >39 mL/min   Anion gap 12 5 - 15  CBC with Differential (Cancer Center Only)  Result Value Ref Range   WBC Count 14.0 (H) 4.0 - 10.5 K/uL   RBC 4.45 3.87 - 5.11 MIL/uL   Hemoglobin 13.1 12.0 - 15.0 g/dL   HCT 59.7 63.9 - 53.9 %   MCV 90.3 80.0 - 100.0 fL   MCH 29.4 26.0 - 34.0 pg   MCHC 32.6 30.0 - 36.0 g/dL   RDW 85.8 88.4 - 84.4 %   Platelet Count 579 (H) 150 - 400 K/uL   nRBC 0.0 0.0 - 0.2 %   Neutrophils Relative % 72 %   Neutro Abs 10.1 (H) 1.7 - 7.7 K/uL   Lymphocytes Relative 20 %   Lymphs Abs 2.8 0.7 - 4.0 K/uL   Monocytes Relative 6 %   Monocytes Absolute 0.8 0.1 - 1.0 K/uL   Eosinophils Relative 1 %   Eosinophils Absolute 0.1 0.0 - 0.5 K/uL   Basophils Relative 1 %   Basophils Absolute 0.1 0.0 - 0.1 K/uL   Immature Granulocytes 0 %   Abs Immature Granulocytes 0.05 0.00 - 0.07 K/uL     RADIOGRAPHIC STUDIES:   CT ABDOMEN PELVIS W CONTRAST Addendum: ADDENDUM REPORT: 02/15/2024  14:31   Electronically Signed    By: Lynwood Landy Raddle M.D.    On: 02/15/2024 14:31   ADDENDUM REPORT: 02/15/2024 14:29   ADDENDUM:  Upon further review, there does appear to be thrombus involving 2  branches of the right portal vein. Critical Value/emergent results  were called by telephone at the time of interpretation on 02/15/2024  at 2:27 pm to provider [facility] , who verbally acknowledged  these results.   Electronically Signed    By: Lynwood Landy Raddle M.D.    On: 02/15/2024 14:29 Narrative: CLINICAL DATA:  Acute epigastric abdominal pain for 3 days.  EXAM: CT ABDOMEN AND PELVIS WITH CONTRAST  TECHNIQUE: Multidetector CT imaging of the abdomen and pelvis was performed using the standard protocol following bolus administration of intravenous contrast.  RADIATION DOSE REDUCTION: This exam was performed according to the departmental dose-optimization program which includes automated exposure control, adjustment of the mA and/or kV according to patient size and/or use of iterative reconstruction technique.  CONTRAST:  75mL OMNIPAQUE  IOHEXOL  350 MG/ML SOLN  COMPARISON:  None Available.  FINDINGS: Lower chest: Minimal right basilar subsegmental atelectasis is noted.  Hepatobiliary: Hepatic steatosis. No cholelithiasis or biliary dilatation.  Pancreas: Unremarkable. No pancreatic ductal dilatation or surrounding inflammatory changes.  Spleen: Normal in size without focal abnormality.  Adrenals/Urinary Tract: Adrenal glands are unremarkable. Kidneys are normal, without renal calculi, focal lesion, or hydronephrosis. Bladder is unremarkable.  Stomach/Bowel: Stomach is within normal limits. Appendix appears normal. No evidence of bowel wall thickening, distention, or inflammatory changes.  Vascular/Lymphatic: No significant vascular findings are present. No enlarged  abdominal or pelvic lymph nodes.  Reproductive: Uterus and bilateral adnexa are  unremarkable.  Other: No abdominal wall hernia or abnormality. No abdominopelvic ascites.  Musculoskeletal: No acute or significant osseous findings.  IMPRESSION: 1. Hepatic steatosis. 2. No acute abnormality seen in the abdomen or pelvis.  Electronically Signed: By: Lynwood Landy Raddle M.D. On: 02/15/2024 12:40 US  Abdomen Limited RUQ (LIVER/GB) EXAM: Right Upper Quadrant Abdominal Ultrasound 02/15/2024 02:12:24 PM  TECHNIQUE: Real-time ultrasonography of the right upper quadrant of the abdomen was performed.  COMPARISON: CT of earlier today.  CLINICAL HISTORY: RUQ abdominal pain.  FINDINGS:  LIVER: The liver demonstrates mildly increased echogenicity. No intrahepatic biliary ductal dilatation.  BILIARY SYSTEM: No pericholecystic fluid or wall thickening. No cholelithiasis. Negative sonographic Murphy's sign. Common bile duct is within normal limits measuring 2 mm. unremarkable.  OTHER: No right upper quadrant ascites.  IMPRESSION: 1. No acute findings. 2. Increased hepatic echogenicity, favoring steatosis.  Electronically signed by: Rockey Kilts MD 02/15/2024 02:18 PM EDT RP Workstation: [id number] DG Chest 2 View CLINICAL DATA:  Midsternal chest pain. Epigastric pain for 3 days. Nausea and vomiting.  EXAM: CHEST - 2 VIEW  COMPARISON:  Radiographs 02/03/2024.  FINDINGS: The heart size and mediastinal contours are stable. There are new linear opacities at the right lung base, most consistent with atelectasis. No confluent airspace disease, edema, pleural effusion or pneumothorax. The bones appear unremarkable.  IMPRESSION: New linear opacities at the right lung base, most consistent with atelectasis. No evidence of pneumonia or edema.  Electronically Signed   By: Elsie Perone M.D.   On: 02/15/2024 10:14    Orders Placed This Encounter  Procedures   CT ABDOMEN PELVIS W CONTRAST    Standing Status:   Future    Expected Date:   05/17/2024     Expiration Date:   04/18/2025    If indicated for the ordered procedure, I authorize the administration of contrast media per Radiology protocol:   Yes    Does the patient have a contrast media/X-ray dye allergy?:   No    Is patient pregnant?:   No    Preferred imaging location?:   [facility]    If indicated for the ordered procedure, I authorize the administration of oral contrast media per Radiology protocol:   Yes   CBC with Differential (Cancer Center Only)    Standing Status:   Future    Number of Occurrences:   1    Expiration Date:   04/18/2025   CMP (Cancer Center only)    Standing Status:   Future    Number of Occurrences:   1    Expiration Date:   04/18/2025   D-dimer, quantitative    Standing Status:   Future    Number of Occurrences:   1    Expiration Date:   04/18/2025   Beta-2-glycoprotein i abs, IgG/M/A    Standing Status:   Future    Number of Occurrences:   1    Expiration Date:   04/18/2025   Cardiolipin antibodies, IgG, IgM, IgA    Standing Status:   Future    Number of Occurrences:   1    Expiration Date:   04/18/2025   Prothrombin gene mutation    Standing Status:   Future    Number of Occurrences:   1    Expiration Date:   04/18/2025   Factor 5 leiden    Standing Status:   Future    Number of Occurrences:  1    Expiration Date:   04/18/2025    Future Appointments  Date Time Provider Department Center  05/02/2024  3:30 PM Shaheen Mende, Ch[place], MD [facility] None  05/23/2024  2:30 PM [facility] PHLEBOTOMIST [facility] None  05/23/2024  3:00 PM Bobette Leyh, MD [facility] None     I spent a total of 55 minutes during this encounter with the patient including review of chart and various tests results, discussions about plan of care and coordination of care plan.  This document was completed utilizing speech recognition software. Grammatical errors, random word insertions, pronoun errors, and incomplete sentences are an occasional consequence of this  system due to software limitations, ambient noise, and hardware issues. Any formal questions or concerns about the content, text or information contained within the body of this dictation should be directly addressed to the provider for clarification.

## 2024-04-18 NOTE — Assessment & Plan Note (Signed)
 Portal vein thrombosis confirmed via CT scan in August 2025, with clot involving two branches of the right portal vein.  Suspected etiology is estrogen-containing oral contraceptives, though further investigation is needed to rule out other causes. Currently on Eliquis  5 mg twice daily, which has resolved her pain.  - Continue Eliquis  5 mg twice daily  - Order blood tests for thrombophilia workup, including factor V Leiden mutation, prothrombin gene mutation, beta-2 glycoprotein antibodies, and anticardiolipin antibodies.  Rest of the thrombophilia workup will be deferred as it can be falsely abnormal given recent thrombosis and current use of Eliquis .  - Schedule follow-up scan before Thanksgiving to assess clot resolution  - Consider switching to aspirin or prophylactic dosing of blood thinner after scan results  She was advised to avoid estrogen containing birth control pills and switch to progestin only medication.  She will discuss this further with her PCP.

## 2024-04-20 LAB — BETA-2-GLYCOPROTEIN I ABS, IGG/M/A
Beta-2 Glyco I IgG: <9 GPI IgG units (ref 0–20)
Beta-2-Glycoprotein I IgA: <9 GPI IgA units (ref 0–25)
Beta-2-Glycoprotein I IgM: <9 GPI IgM units (ref 0–32)

## 2024-04-22 LAB — CARDIOLIPIN ANTIBODIES, IGG, IGM, IGA
Anticardiolipin IgA: <9 U/mL (ref 0–11)
Anticardiolipin IgG: <9 GPL U/mL (ref 0–14)
Anticardiolipin IgM: <9 [MPL'U]/mL (ref 0–12)

## 2024-04-24 LAB — FACTOR 5 LEIDEN

## 2024-04-24 LAB — PROTHROMBIN GENE MUTATION

## 2024-05-02 ENCOUNTER — Inpatient Hospital Stay: Attending: Oncology | Admitting: Oncology

## 2024-05-02 DIAGNOSIS — K7689 Other specified diseases of liver: Secondary | ICD-10-CM | POA: Insufficient documentation

## 2024-05-02 DIAGNOSIS — Z90411 Acquired partial absence of pancreas: Secondary | ICD-10-CM | POA: Insufficient documentation

## 2024-05-02 DIAGNOSIS — F17200 Nicotine dependence, unspecified, uncomplicated: Secondary | ICD-10-CM | POA: Insufficient documentation

## 2024-05-02 DIAGNOSIS — F419 Anxiety disorder, unspecified: Secondary | ICD-10-CM | POA: Insufficient documentation

## 2024-05-02 DIAGNOSIS — Z8673 Personal history of transient ischemic attack (TIA), and cerebral infarction without residual deficits: Secondary | ICD-10-CM | POA: Insufficient documentation

## 2024-05-02 DIAGNOSIS — D6851 Activated protein C resistance: Secondary | ICD-10-CM | POA: Insufficient documentation

## 2024-05-02 DIAGNOSIS — J45909 Unspecified asthma, uncomplicated: Secondary | ICD-10-CM | POA: Insufficient documentation

## 2024-05-02 DIAGNOSIS — D75838 Other thrombocytosis: Secondary | ICD-10-CM | POA: Insufficient documentation

## 2024-05-02 DIAGNOSIS — Z882 Allergy status to sulfonamides status: Secondary | ICD-10-CM | POA: Insufficient documentation

## 2024-05-02 DIAGNOSIS — Z7901 Long term (current) use of anticoagulants: Secondary | ICD-10-CM | POA: Insufficient documentation

## 2024-05-02 DIAGNOSIS — K760 Fatty (change of) liver, not elsewhere classified: Secondary | ICD-10-CM | POA: Insufficient documentation

## 2024-05-02 DIAGNOSIS — F32A Depression, unspecified: Secondary | ICD-10-CM | POA: Insufficient documentation

## 2024-05-02 DIAGNOSIS — I81 Portal vein thrombosis: Secondary | ICD-10-CM | POA: Insufficient documentation

## 2024-05-02 DIAGNOSIS — Z91040 Latex allergy status: Secondary | ICD-10-CM | POA: Insufficient documentation

## 2024-05-02 DIAGNOSIS — G809 Cerebral palsy, unspecified: Secondary | ICD-10-CM | POA: Insufficient documentation

## 2024-05-02 DIAGNOSIS — Z86718 Personal history of other venous thrombosis and embolism: Secondary | ICD-10-CM | POA: Insufficient documentation

## 2024-05-02 DIAGNOSIS — Z79899 Other long term (current) drug therapy: Secondary | ICD-10-CM | POA: Insufficient documentation

## 2024-05-02 DIAGNOSIS — Z91011 Allergy to milk products, unspecified: Secondary | ICD-10-CM | POA: Insufficient documentation

## 2024-05-02 DIAGNOSIS — Z793 Long term (current) use of hormonal contraceptives: Secondary | ICD-10-CM | POA: Insufficient documentation

## 2024-05-02 DIAGNOSIS — Z9081 Acquired absence of spleen: Secondary | ICD-10-CM | POA: Insufficient documentation

## 2024-05-02 NOTE — Progress Notes (Signed)
 [facility]  HEMATOLOGY-ONCOLOGY ELECTRONIC VISIT PROGRESS NOTE  PATIENT NAME: Leslie Holt   MR#: [id number] DOB: 1980-12-18  DATE OF SERVICE: 05/02/2024  Patient Care Team: Barbra Odor, NP as PCP - General (Nurse Practitioner)  I connected with the patient via telephone conference and verified that I am speaking with the correct person using two identifiers. The patient's location is at home and I am providing care from the [facility].  I discussed the limitations, risks, security and privacy concerns of performing an evaluation and management service by e-visits and the availability of in person appointments. I also discussed with the patient that there may be a patient responsible charge related to this service. The patient expressed understanding and agreed to proceed.   ASSESSMENT & PLAN:   Leslie Holt is a 43 y.o. lady with a past medical history of attention deficit disorder, anxiety, depression, asthma, GERD, was referred to our service for evaluation after she was diagnosed with portal vein thrombosis in August 2025.   Portal vein thrombosis Portal vein thrombosis confirmed via CT scan in August 2025, with clot involving two branches of the right portal vein.   Suspected etiology is estrogen-containing oral contraceptives, though further investigation is needed to rule out other causes. Currently on Eliquis  5 mg twice daily, which has resolved her pain.  On her consultation with us  on 04/18/2024, we pursued hypercoagulable workup.  Factor V Leiden mutation, prothrombin gene mutation were negative.  Beta-2 glycoprotein antibodies and anticoagulant antibodies were negative.  D-dimer was undetectable. Rest of the thrombophilia workup was deferred as it can be falsely abnormal given recent thrombosis and current use of Eliquis .   - Continue Eliquis  5 mg twice daily    - Schedule follow-up scan before Thanksgiving to assess clot resolution   - Consider  switching to aspirin or prophylactic dosing of blood thinner after scan results   She was advised to avoid estrogen containing birth control pills and switch to progestin only medication.  She will discuss this further with her PCP.  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 Platelet and white blood cell counts remain elevated, consistent with expected findings post-splenectomy. No new symptoms reported. - Continue monitoring platelet and white blood cell counts.    I discussed the assessment and treatment plan with the patient. The patient was provided an opportunity to ask questions and all were answered. The patient agreed with the plan and demonstrated an understanding of the instructions. The patient was advised to call back or seek an in-person evaluation if the symptoms worsen or if the condition fails to improve as anticipated.    I spent 12 minutes over the phone with the patient reviewing test results, discuss management and coordination/planning of care.  [name], MD 05/02/2024 3:40 PM [facility] - A DEPT OF [facility] [address] [place] [address] Dept: [phone] Dept Fax: [phone]   INTERVAL HISTORY:  Please see above for problem oriented charting.  The purpose of today's discussion is to explain recent lab results and to formulate plan of care.  Discussed the use of AI scribe software for clinical note transcription with the patient, who gave verbal consent to proceed.  [name] is a 43 year old female with a history of splenectomy who presents for follow-up regarding her hematological status.  She has a history of splenectomy, resulting  in chronically elevated platelet and white blood cell counts. No new pain or symptoms have been noted since her  last visit.  Recent laboratory workup showed stable kidney and liver function tests, normal electrolytes, and an undetectable D-dimer level. Genetic testing for clotting disorders, including beta 2 glycoprotein antibody, anti-cardiolipin antibodies, prothrombin gene mutation, and factor V Leiden mutation, were all negative. Some tests could not be completed due to her recent clotting event and current use of blood thinners.   SUMMARY OF HEMATOLOGY HISTORY:  She presented for hematology consultation following a hospitalization for portal vein thrombosis.   In August 2025, she was hospitalized for portal vein thrombosis after experiencing abdominal pain, nausea, and diarrhea for three to four days. Initially attributing these symptoms to food poisoning, she sought medical attention when she persisted. Imaging studies during her hospital stay included an ultrasound, which the patient recalls as identifying the blood clots, and a CT scan, for which an addendum was later made to the report. She was discharged on Eliquis  5 mg twice daily, and her symptoms improved approximately 24 hours after admission with no recurrence of pain since then.   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 This emergency surgery resulted in the premature birth of her son, who has cerebral palsy due to anoxic injury. She has a history of high platelet counts, with recent values ranging from 423,000 to 527,000, and elevated white blood cell counts, with recent values between 14,000 and 18,000.   Family history is notable for her mother having a stroke at age 45, with no other family history of blood clotting disorders. She has been on estrogen-containing birth control pills, which are suspected to have contributed to her clotting issue. She took her last dose today and is considering switching to a progesterone-only  formulation.   She does not smoke and has abstained from alcohol since starting blood thinners. She has a history of allergies for which she uses an inhaler and is currently receiving allergy shots. No current chest pain or breathing difficulties. She was in the [employer] but left after her son was born to care for him due to his disability.  Portal vein thrombosis confirmed via CT scan in August 2025, with clot involving two branches of the right portal vein.   Suspected etiology is estrogen-containing oral contraceptives, though further investigation is needed to rule out other causes. Currently on Eliquis  5 mg twice daily, which has resolved her pain.  On her consultation with us  on 04/18/2024, we pursued hypercoagulable workup.  Factor V Leiden mutation, prothrombin gene mutation were negative.  Beta-2 glycoprotein antibodies and anticoagulant antibodies were negative.  D-dimer was undetectable. Rest of the thrombophilia workup was deferred as it can be falsely abnormal given recent thrombosis and current use of Eliquis .   - Continue Eliquis  5 mg twice daily    - Schedule follow-up scan before Thanksgiving to assess clot resolution   - Consider switching to aspirin or prophylactic dosing of blood thinner after scan results   She was advised to avoid estrogen containing birth control pills and switch to progestin only medication.  She will discuss this further with her PCP.   REVIEW OF SYSTEMS:    Review of Systems - Oncology  All other pertinent systems were reviewed with the patient and are negative.  I have reviewed the past medical history, past surgical history, social history and family history with the  patient and they are unchanged from previous note.  ALLERGIES:  She is allergic to shellfish allergy, sulfa antibiotics, goat milk, and latex.  MEDICATIONS:  Current Outpatient Medications  Medication Sig Dispense Refill   APIXABAN  (ELIQUIS ) VTE STARTER PACK (10MG  AND 5MG ) Take 10  mg by mouth 2 (two) times daily for 7 days, THEN 5 mg 2 (two) times daily. 74 tablet 0   buPROPion  (WELLBUTRIN  XL) 300 MG 24 hr tablet Take 300 mg by mouth daily.     cetirizine (ZYRTEC) 10 MG tablet Take 10 mg by mouth daily.     escitalopram  (LEXAPRO ) 10 MG tablet Take 10 mg by mouth daily.     Norgestimate-Ethinyl Estradiol Triphasic 0.18/0.215/0.25 MG-25 MCG tab Take 1 tablet by mouth daily.     PROAIR RESPICLICK 108 (90 Base) MCG/ACT AEPB Inhale 2 puffs into the lungs 3 (three) times daily as needed (cough).     Triamcinolone Acetonide (NASACORT ALLERGY 24HR NA) Place 1 spray into the nose 2 (two) times daily.     No current facility-administered medications for this visit.    PHYSICAL EXAMINATION:  Not performed today as it was a phone only visit  LABORATORY DATA:   I have reviewed the data as listed.  Recent Results (from the past 2160 hours)  Lipase, blood     Status: None   Collection Time: 02/15/24 10:05 AM  Result Value Ref Range   Lipase 29 11 - 51 U/L    Comment: Performed at [facility], [address], [place], [place] [address]  Comprehensive metabolic panel     Status: Abnormal   Collection Time: 02/15/24 10:05 AM  Result Value Ref Range   Sodium 136 135 - 145 mmol/L   Potassium 4.4 3.5 - 5.1 mmol/L   Chloride 102 98 - 111 mmol/L   CO2 25 22 - 32 mmol/L   Glucose, Bld 123 (H) 70 - 99 mg/dL    Comment: Glucose reference range applies only to samples taken after fasting for at least 8 hours.   BUN 16 6 - 20 mg/dL   Creatinine, Ser 9.13 0.44 - 1.00 mg/dL   Calcium 9.1 8.9 - 89.6 mg/dL   Total Protein 6.8 6.5 - 8.1 g/dL   Albumin 3.4 (L) 3.5 - 5.0 g/dL   AST 15 15 - 41 U/L   ALT 20 0 - 44 U/L   Alkaline Phosphatase 89 38 - 126 U/L   Total Bilirubin 0.9 0.0 - 1.2 mg/dL   GFR, Estimated >39 >39 mL/min    Comment: (NOTE) Calculated using the CKD-EPI Creatinine Equation (2021)    Anion gap 9 5 - 15    Comment: Performed at [facility], [address] [place], [place] [address]  CBC     Status: Abnormal   Collection Time: 02/15/24 10:05 AM  Result Value Ref Range   WBC 18.7 (H) 4.0 - 10.5 K/uL   RBC 4.71 3.87 - 5.11 MIL/uL   Hemoglobin 14.0 12.0 - 15.0 g/dL   HCT 58.0 63.9 - 53.9 %   MCV 89.0 80.0 - 100.0 fL   MCH 29.7 26.0 - 34.0 pg   MCHC 33.4 30.0 - 36.0 g/dL   RDW 85.6 88.4 - 84.4 %   Platelets 527 (H) 150 - 400 K/uL   nRBC 0.0 0.0 - 0.2 %    Comment: Performed at [facility], [address] [place], [place] [address]  hCG, serum, qualitative  Status: None   Collection Time: 02/15/24 10:05 AM  Result Value Ref Range   Preg, Serum NEGATIVE NEGATIVE    Comment:        THE SENSITIVITY OF THIS METHODOLOGY IS >10 mIU/mL. Performed at [facility], [address], [place], [place] [address]   Urinalysis, Routine w reflex microscopic -Urine, Clean Catch     Status: Abnormal   Collection Time: 02/15/24 12:27 PM  Result Value Ref Range   Color, Urine YELLOW YELLOW   APPearance CLEAR CLEAR   Specific Gravity, Urine 1.012 1.005 - 1.030   pH 6.0 5.0 - 8.0   Glucose, UA NEGATIVE NEGATIVE mg/dL   Hgb urine dipstick SMALL (A) NEGATIVE   Bilirubin Urine NEGATIVE NEGATIVE   Ketones, ur NEGATIVE NEGATIVE mg/dL   Protein, ur NEGATIVE NEGATIVE mg/dL   Nitrite NEGATIVE NEGATIVE   Leukocytes,Ua NEGATIVE NEGATIVE   RBC / HPF 0-5 0 - 5 RBC/hpf   WBC, UA 0-5 0 - 5 WBC/hpf   Bacteria, UA RARE (A) NONE SEEN   Squamous Epithelial / HPF 0-5 0 - 5 /HPF   Mucus PRESENT     Comment: Performed at [facility], [address], [place], [place] [address]  HIV Antibody (routine testing w rflx)     Status: None   Collection Time: 02/15/24  3:40 PM  Result Value Ref Range   HIV Screen 4th Generation wRfx Non Reactive Non Reactive    Comment: Performed at [facility], [address], [place], [place] [address]  I-Stat CG4 Lactic Acid     Status: None   Collection Time: 02/15/24  5:33 PM  Result Value Ref Range    Lactic Acid, Venous 0.7 0.5 - 1.9 mmol/L  Heparin  level (unfractionated)     Status: None   Collection Time: 02/15/24  9:36 PM  Result Value Ref Range   Heparin  Unfractionated 0.34 0.30 - 0.70 IU/mL    Comment: (NOTE) The clinical reportable range upper limit is being lowered to >1.10 to align with the FDA approved guidance for the current laboratory assay.  If heparin  results are below expected values, and patient dosage has  been confirmed, suggest follow up testing of antithrombin III levels. Performed at [facility], [address] [place], [place] [address]   Heparin  level (unfractionated)     Status: None   Collection Time: 02/16/24  3:15 AM  Result Value Ref Range   Heparin  Unfractionated 0.38 0.30 - 0.70 IU/mL    Comment: (NOTE) The clinical reportable range upper limit is being lowered to >1.10 to align with the FDA approved guidance for the current laboratory assay.  If heparin  results are below expected values, and patient dosage has  been confirmed, suggest follow up testing of antithrombin III levels. Performed at [facility], [address], [place], [place] [address]   CBC     Status: Abnormal   Collection Time: 02/16/24  3:15 AM  Result Value Ref Range   WBC 16.6 (H) 4.0 - 10.5 K/uL   RBC 4.19 3.87 - 5.11 MIL/uL   Hemoglobin 12.5 12.0 - 15.0 g/dL   HCT 61.2 63.9 - 53.9 %   MCV 92.4 80.0 - 100.0 fL   MCH 29.8 26.0 - 34.0 pg   MCHC 32.3 30.0 - 36.0 g/dL   RDW 85.5 88.4 - 84.4 %   Platelets 423 (H) 150 - 400 K/uL   nRBC 0.0 0.0 - 0.2 %  Comment: Performed at [facility], [address] [place], [place] [address]  Comprehensive metabolic panel     Status: Abnormal   Collection Time: 02/16/24  3:15 AM  Result Value Ref Range   Sodium 136 135 - 145 mmol/L   Potassium 4.1 3.5 - 5.1 mmol/L   Chloride 101 98 - 111 mmol/L   CO2 23 22 - 32 mmol/L   Glucose, Bld 108 (H) 70 - 99 mg/dL    Comment: Glucose reference range applies only to  samples taken after fasting for at least 8 hours.   BUN 9 6 - 20 mg/dL   Creatinine, Ser 9.24 0.44 - 1.00 mg/dL   Calcium 8.7 (L) 8.9 - 10.3 mg/dL   Total Protein 5.9 (L) 6.5 - 8.1 g/dL   Albumin 2.9 (L) 3.5 - 5.0 g/dL   AST 12 (L) 15 - 41 U/L   ALT 17 0 - 44 U/L   Alkaline Phosphatase 74 38 - 126 U/L   Total Bilirubin 0.7 0.0 - 1.2 mg/dL   GFR, Estimated >39 >39 mL/min    Comment: (NOTE) Calculated using the CKD-EPI Creatinine Equation (2021)    Anion gap 12 5 - 15    Comment: Performed at [facility], [address], [place], [place] [address]  Heparin  level (unfractionated)     Status: None   Collection Time: 02/17/24  3:03 AM  Result Value Ref Range   Heparin  Unfractionated 0.32 0.30 - 0.70 IU/mL    Comment: (NOTE) The clinical reportable range upper limit is being lowered to >1.10 to align with the FDA approved guidance for the current laboratory assay.  If heparin  results are below expected values, and patient dosage has  been confirmed, suggest follow up testing of antithrombin III levels. Performed at [facility], [address], [place], [place] [address]   CBC     Status: Abnormal   Collection Time: 02/17/24  3:03 AM  Result Value Ref Range   WBC 14.8 (H) 4.0 - 10.5 K/uL   RBC 4.11 3.87 - 5.11 MIL/uL   Hemoglobin 12.2 12.0 - 15.0 g/dL   HCT 63.1 63.9 - 53.9 %   MCV 89.5 80.0 - 100.0 fL   MCH 29.7 26.0 - 34.0 pg   MCHC 33.2 30.0 - 36.0 g/dL   RDW 85.9 88.4 - 84.4 %   Platelets 504 (H) 150 - 400 K/uL   nRBC 0.0 0.0 - 0.2 %    Comment: Performed at [facility], [address], [place], [place] [address]  Comprehensive metabolic panel with GFR     Status: Abnormal   Collection Time: 02/17/24  3:03 AM  Result Value Ref Range   Sodium 137 135 - 145 mmol/L   Potassium 4.0 3.5 - 5.1 mmol/L   Chloride 103 98 - 111 mmol/L   CO2 25 22 - 32 mmol/L   Glucose, Bld 110 (H) 70 - 99 mg/dL    Comment: Glucose reference range applies only to samples  taken after fasting for at least 8 hours.   BUN 10 6 - 20 mg/dL   Creatinine, Ser 9.10 0.44 - 1.00 mg/dL   Calcium 8.7 (L) 8.9 - 10.3 mg/dL   Total Protein 6.1 (L) 6.5 - 8.1 g/dL   Albumin 2.9 (L) 3.5 - 5.0 g/dL   AST 18 15 - 41 U/L   ALT 22 0 - 44 U/L   Alkaline Phosphatase 82 38 - 126 U/L  Total Bilirubin 0.5 0.0 - 1.2 mg/dL   GFR, Estimated >39 >39 mL/min    Comment: (NOTE) Calculated using the CKD-EPI Creatinine Equation (2021)    Anion gap 9 5 - 15    Comment: Performed at [facility], [address], [place], [place] [address]  Factor 5 leiden     Status: None   Collection Time: 04/18/24 11:50 AM  Result Value Ref Range   Recommendations-F5LEID: Comment     Comment: (NOTE) Result: c.1601G>A (p.Arg534Gln) - Not Detected This result is not associated with an increased risk for venous thromboembolism. See Additional Clinical Information and Comments. Additional Clinical Information:    Venous thromboembolism is a multifactorial disease influenced by genetic, environmental, and circumstantial risk factors. The c.1601G>A (p. Arg534Gln) variant in the F5 gene, commonly referred to as Factor V Leiden, is a genetic risk factor for venous thromboembolism. Heterozygous carriers of this variant have a 6- to 8- fold increased risk for venous thromboembolism. Individuals homozygous for this variant (ie, with a copy of the variant on each chromosome) have an approximately 80-fold increased risk for venous thromboembolism. Individuals who carry both a c.*97G>A variant in the F2 gene and Factor V Leiden have an approximately 20-fold increased risk for venous thromboembolism. Risks are likely to be even higher in more complex genotype combinations  involving the F2 c.*97G>A variant and Factor V Leiden (PMID: 66325232). Additional risk factors include but are not limited to: deficiency of protein C, protein S, or antithrombin III, age, female sex, personal or family history of deep  vein thromboembolism, smoking, surgery, prolonged immobilization, malignant neoplasm, tamoxifen treatment, raloxifene treatment, oral contraceptive use, hormone replacement therapy, and pregnancy. Management of thrombotic risk and thrombotic events should follow established guidelines and fit the clinical circumstance. This result cannot predict the occurrence or recurrence of a thrombotic event. Comment:    Genetic counseling is recommended to discuss the potential clinical implications of positive results, as well as recommendations for testing family members.    Genetic Coordinators are available for health care providers to discuss results at [phone]). Test Details:    Variant Analyzed: c.1601G>A (p. Arg534Gln), referre d to as Factor V Leiden Methods/Limitations:    DNA analysis of the F5 gene (NM_000130.5) was performed by PCR amplification followed by electrophoresis. The diagnostic sensitivity is >99%. Results must be combined with clinical information for the most accurate interpretation. Molecular-based testing is highly accurate, but as in any laboratory test, diagnostic errors may occur. False positive or false negative results may occur for reasons that include genetic variants, blood transfusions, bone marrow transplantation, somatic or tissue-specific mosaicism, mislabeled samples, or erroneous representation of family relationships.    This test was developed and its performance characteristics determined by [facility]. It has not been cleared or approved by the Food and Drug Administration. References:    Bhatt S, Taylor AK, Lozano R, Grody [facility], Signa [facility]; ACMG Professional Practice and Guidelines Committee. Addendum: [employer] of Medical Genetics consensus statemen t on factor V Leiden mutation testing. Genet Med. 2021 Mar 5. doi: 89.8961/d58563-978- 01108-x. PMID: 66325232.    Hosey RUSH. Factor V Leiden Thrombophilia. 1999 May 14 (Updated  2018 Jan 4). In: Juliene POSNER, Ardinger HH, Pagon RA, et al., editors. GeneReviews(R) (Internet). [address] [facility]): [employer] , [place]; 8006-7978. Available from: [email or web address]    Laurita GORMAN Waddell BRIDGETT, Huang X, Luo B, Spector EB, Ileana SHAUNNA Gal CS; ACMG Laboratory Quality Assurance Committee. Venous thromboembolism laboratory testing (factor V Leiden and factor II  c. *97G>A), 2018 update: a technical standard of the [employer] of [employer] and [employer] (ACMG). Genet Med. 2018 Izr;79(87): 8510-8501. doi: 10.1038/s41436-(930)560-8418-z. Epub 2018 Oct 5. PMID: 69702301.    Reviewed By: Comment     Comment: (NOTE) Technical Component performed at [employer] [facility] Professional Component performed by: Fairy WENDI Aid, PhD, [facility] [id number], [facility], [address] [place] [address] Performed At: [facility] [address] [place], [place] [id number] Loran Gales MDPhD Ey:[phone]   Prothrombin gene mutation     Status: None   Collection Time: 04/18/24 11:50 AM  Result Value Ref Range   Recommendations-PTGENE: Comment     Comment: (NOTE) Result: c.*97G>A - Not Detected This result is not associated with an increased risk for venous thromboembolism. See Additional Clinical Information and Comments. Additional Clinical Information: Venous thromboembolism is a multifactorial disease influenced by genetic, environmental, and circumstantial risk factors. The c.*97G>A variant in the F2 gene is a genetic risk factor for venous thromboembolism. Heterozygous carriers have a 2- to 4-fold increased risk for venous thromboembolism. Homozygotes for the c.*97G>A variant are rare. The annual risk of VTE in homozygotes has been reported to be 1.1%/year. Individuals who carry both a c.*97G>A variant in the F2 gene and a c.1601G>A (p. Arg534Gln) variant in the F5 gene (commonly referred to as Factor V Leiden) have an approximately 20- fold increased risk  for venous thromboembolism. Risks are likely to be even higher in more complex genotype combinations involving the F2 c.*97G>A variant and Factor V Leiden (PMID:  66325232). Additional risk factors include but are not limited to: deficiency of protein C, protein S, or antithrombin III, age, female sex, personal or family history of deep vein thromboembolism, smoking, surgery, prolonged immobilization, malignant neoplasm, tamoxifen treatment, raloxifene treatment, oral contraceptive use, hormone replacement therapy, and pregnancy. Management of thrombotic risk and thrombotic events should follow established guidelines and fit the clinical circumstance. This result cannot predict the occurrence or recurrence of a thrombotic event. Comments: Genetic counseling is recommended to discuss the potential clinical implications of positive results, as well as recommendations for testing family members. Genetic Coordinators are available for health care providers to discuss results at [phone]). Test Details: Variant analyzed: c.*97G>A, previously referred to as G20210A Methods/Limitations: DNA analysis of the F2 gene (NM_000 506.5) was performed by PCR amplification followed by restriction enzyme analysis. The diagnostic sensitivity is >99%. Results must be combined with clinical information for the most accurate interpretation. Molecular-based testing is highly accurate, but as in any laboratory test, diagnostic errors may occur. False positive or false negative results may occur for reasons that include genetic variants, blood transfusions, bone marrow transplantation, somatic or tissue-specific mosaicism, mislabeled samples, or erroneous representation of family relationships. This test was developed and its performance characteristics determined by [facility]. It has not been cleared or approved by the Food and Drug Administration. References: Bhatt S, Taylor AK, Lozano R, Grody  [facility], Signa [facility]; ACMG Professional Practice and Guidelines Committee. Addendum: [employer] of [employer] Genetics consensus statement on factor V Leiden mutation testing. Genet Med. 2021 Mar 5. doi: 89.8961/d585 [phone]-x. PMID: 66325232. Hosey [place]. Prothrombin Thrombophilia. 2006 Jul 25 [Updated 2021 Feb 4]. In: Juliene POSNER, Ardinger HH, Pagon RA, et al., editors. GeneReviews(R) [Internet]. [address] ([place]): [place] , [place]; 8006-7978. Available from: [email or web address] Laurita GORMAN Waddell BRIDGETT, Huang X, Luo B, Spector EB, Ileana SHAUNNA Gal CS; [facility] Quality Assurance Committee. Venous thromboembolism laboratory testing (factor V Leiden and factor II c.*97G>A), 2018 update: a  technical standard of the [employer] of [employer] [facility] [employer] ([facility]). Genet Med. 2018 Dec;20(12):1489-1498. doi: 10.1038/s41436-([id number]) [id number]-z. Epub 2018 Oct 5. PMID: 69702301.    Reviewed by: Comment     Comment: (NOTE) Technical Component performed at [facility] Professional Component performed by: Larose Agent, PhD, [facility] [id number], [facility], [address] [place] [address] Performed At: [facility] [address] [place], [place] [id number] Loran Gales MDPhD Ey:[phone]   Cardiolipin antibodies, IgG, IgM, IgA     Status: None   Collection Time: 04/18/24 11:50 AM  Result Value Ref Range   Anticardiolipin IgG <9 0 - 14 GPL U/mL    Comment: (NOTE)                          Negative:              <15                          Indeterminate:     15 - 20                          Low-Med Positive: >20 - 80                          High Positive:         >80    Anticardiolipin IgM <9 0 - 12 MPL U/mL    Comment: (NOTE)                          Negative:              <13                          Indeterminate:     13 - 20                          Low-Med Positive: >20 - 80                          High Positive:         >80    Anticardiolipin  IgA <9 0 - 11 APL U/mL    Comment: (NOTE)                          Negative:              <12                          Indeterminate:     12 - 20                          Low-Med Positive: >20 - 80                          High Positive:         >80 Performed At: [facility] [address] [place], [place] [id number] Jennette Shorter MD Ey:[phone]   Beta-2-glycoprotein i abs, IgG/M/A     Status: None   Collection Time: 04/18/24 11:50 AM  Result Value Ref Range   Beta-2 Glyco I IgG <9 0 - 20 GPI IgG units   Beta-2-Glycoprotein I IgM <9 0 - 32 GPI IgM units    Comment: (NOTE) Performed At: [facility] [address] [place], [place] [id number] Jennette Shorter MD Ey:[id number]    Beta-2-Glycoprotein I IgA <9 0 - 25 GPI IgA units  D-dimer, quantitative     Status: None   Collection Time: 04/18/24 11:50 AM  Result Value Ref Range   D-Dimer, Quant <0.27 0.00 - 0.50 ug/mL-FEU    Comment: (NOTE) At the manufacturer cut-off value of 0.5 g/mL FEU, this assay has a negative predictive value of 95-100%.This assay is intended for use in conjunction with a clinical pretest probability (PTP) assessment model to exclude pulmonary embolism (PE) and deep venous thrombosis (DVT) in outpatients suspected of PE or DVT. Results should be correlated with clinical presentation. Performed at [facility], [address] [place], [place] [address]   CMP (Cancer Center only)     Status: None   Collection Time: 04/18/24 11:50 AM  Result Value Ref Range   Sodium 139 135 - 145 mmol/L   Potassium 4.5 3.5 - 5.1 mmol/L   Chloride 102 98 - 111 mmol/L   CO2 26 22 - 32 mmol/L   Glucose, Bld 98 70 - 99 mg/dL    Comment: Glucose reference range applies only to samples taken after fasting for at least 8 hours.   BUN 14 6 - 20 mg/dL   Creatinine 9.24 9.55 - 1.00 mg/dL   Calcium 9.9 8.9 - 89.6 mg/dL   Total Protein 7.4 6.5 - 8.1 g/dL   Albumin 4.2 3.5 - 5.0 g/dL   AST  15 15 - 41 U/L   ALT 15 0 - 44 U/L   Alkaline Phosphatase 125 38 - 126 U/L   Total Bilirubin 0.3 0.0 - 1.2 mg/dL   GFR, Estimated >39 >39 mL/min    Comment: (NOTE) Calculated using the CKD-EPI Creatinine Equation (2021)    Anion gap 12 5 - 15    Comment: Performed at [facility], [address] [place], [place] [address]  CBC with Differential (Cancer Center Only)     Status: Abnormal   Collection Time: 04/18/24 11:50 AM  Result Value Ref Range   WBC Count 14.0 (H) 4.0 - 10.5 K/uL   RBC 4.45 3.87 - 5.11 MIL/uL   Hemoglobin 13.1 12.0 - 15.0 g/dL   HCT 59.7 63.9 - 53.9 %   MCV 90.3 80.0 - 100.0 fL   MCH 29.4 26.0 - 34.0 pg   MCHC 32.6 30.0 - 36.0 g/dL   RDW 85.8 88.4 - 84.4 %   Platelet Count 579 (H) 150 - 400 K/uL   nRBC 0.0 0.0 - 0.2 %   Neutrophils Relative % 72 %   Neutro Abs 10.1 (H) 1.7 - 7.7 K/uL   Lymphocytes Relative 20 %   Lymphs Abs 2.8 0.7 - 4.0 K/uL   Monocytes Relative 6 %   Monocytes Absolute 0.8 0.1 - 1.0 K/uL   Eosinophils Relative 1 %   Eosinophils Absolute 0.1 0.0 - 0.5 K/uL   Basophils Relative 1 %   Basophils Absolute 0.1 0.0 - 0.1 K/uL   Immature Granulocytes 0 %   Abs Immature Granulocytes 0.05 0.00 - 0.07 K/uL    Comment: Performed at [facility], [address] [place], [place] [address]     RADIOGRAPHIC STUDIES:  No recent pertinent imaging studies  available to review.  No orders of the defined types were placed in this encounter.    Future Appointments  Date Time Provider Department Center  05/17/2024  4:00 PM [facility] 1 [facility]  05/23/2024  2:30 PM [facility] PHLEBOTOMIST [facility] None  05/23/2024  3:00 PM Arush Gatliff, Chinita, MD [facility] None    This document was completed utilizing speech recognition software. Grammatical errors, random word insertions, pronoun errors, and incomplete sentences are an occasional consequence of this system due to software limitations, ambient noise, and  hardware issues. Any formal questions or concerns about the content, text or information contained within the body of this dictation should be directly addressed to the provider for clarification.

## 2024-05-13 ENCOUNTER — Encounter: Payer: Self-pay | Admitting: Oncology

## 2024-05-13 NOTE — Assessment & Plan Note (Signed)
 Portal vein thrombosis confirmed via CT scan in August 2025, with clot involving two branches of the right portal vein.   Suspected etiology is estrogen-containing oral contraceptives, though further investigation is needed to rule out other causes. Currently on Eliquis  5 mg twice daily, which has resolved her pain.  On her consultation with us  on 04/18/2024, we pursued hypercoagulable workup.  Factor V Leiden mutation, prothrombin gene mutation were negative.  Beta-2 glycoprotein antibodies and anticoagulant antibodies were negative.  D-dimer was undetectable. Rest of the thrombophilia workup was deferred as it can be falsely abnormal given recent thrombosis and current use of Eliquis .   - Continue Eliquis  5 mg twice daily    - Schedule follow-up scan before Thanksgiving to assess clot resolution   - Consider switching to aspirin or prophylactic dosing of blood thinner after scan results   She was advised to avoid estrogen containing birth control pills and switch to progestin only medication.  She will discuss this further with her PCP.

## 2024-05-17 ENCOUNTER — Ambulatory Visit (HOSPITAL_BASED_OUTPATIENT_CLINIC_OR_DEPARTMENT_OTHER)
Admission: RE | Admit: 2024-05-17 | Discharge: 2024-05-17 | Disposition: A | Discharge status: Home / Self Care | Source: Ambulatory Visit | Attending: Oncology | Admitting: Oncology

## 2024-05-17 DIAGNOSIS — I81 Portal vein thrombosis: Secondary | ICD-10-CM | POA: Insufficient documentation

## 2024-05-17 MED ORDER — IOHEXOL 300 MG/ML  SOLN
100.0000 mL | Freq: Once | INTRAMUSCULAR | Status: AC | PRN
  Administered 2024-05-17: 100 mL via INTRAVENOUS

## 2024-05-19 ENCOUNTER — Other Ambulatory Visit: Payer: Self-pay | Admitting: Oncology

## 2024-05-19 DIAGNOSIS — I81 Portal vein thrombosis: Secondary | ICD-10-CM

## 2024-05-23 ENCOUNTER — Inpatient Hospital Stay (HOSPITAL_BASED_OUTPATIENT_CLINIC_OR_DEPARTMENT_OTHER): Admitting: Oncology

## 2024-05-23 ENCOUNTER — Encounter: Payer: Self-pay | Admitting: Oncology

## 2024-05-23 ENCOUNTER — Inpatient Hospital Stay

## 2024-05-23 ENCOUNTER — Telehealth: Payer: Self-pay | Admitting: *Deleted

## 2024-05-23 VITALS — BP 119/69 | HR 76 | Temp 98.6°F | Resp 18 | Ht 65.0 in | Wt 234.3 lb

## 2024-05-23 DIAGNOSIS — Z8673 Personal history of transient ischemic attack (TIA), and cerebral infarction without residual deficits: Secondary | ICD-10-CM | POA: Diagnosis not present

## 2024-05-23 DIAGNOSIS — I81 Portal vein thrombosis: Secondary | ICD-10-CM | POA: Diagnosis present

## 2024-05-23 DIAGNOSIS — Z882 Allergy status to sulfonamides status: Secondary | ICD-10-CM | POA: Diagnosis not present

## 2024-05-23 DIAGNOSIS — Z79899 Other long term (current) drug therapy: Secondary | ICD-10-CM | POA: Diagnosis not present

## 2024-05-23 DIAGNOSIS — K760 Fatty (change of) liver, not elsewhere classified: Secondary | ICD-10-CM | POA: Diagnosis not present

## 2024-05-23 DIAGNOSIS — D75838 Other thrombocytosis: Secondary | ICD-10-CM | POA: Diagnosis not present

## 2024-05-23 DIAGNOSIS — Z9081 Acquired absence of spleen: Secondary | ICD-10-CM | POA: Diagnosis not present

## 2024-05-23 DIAGNOSIS — F419 Anxiety disorder, unspecified: Secondary | ICD-10-CM | POA: Diagnosis not present

## 2024-05-23 DIAGNOSIS — F17200 Nicotine dependence, unspecified, uncomplicated: Secondary | ICD-10-CM | POA: Diagnosis not present

## 2024-05-23 DIAGNOSIS — Z793 Long term (current) use of hormonal contraceptives: Secondary | ICD-10-CM | POA: Diagnosis not present

## 2024-05-23 DIAGNOSIS — Z86718 Personal history of other venous thrombosis and embolism: Secondary | ICD-10-CM | POA: Diagnosis not present

## 2024-05-23 DIAGNOSIS — Z7901 Long term (current) use of anticoagulants: Secondary | ICD-10-CM | POA: Diagnosis not present

## 2024-05-23 DIAGNOSIS — Z90411 Acquired partial absence of pancreas: Secondary | ICD-10-CM | POA: Diagnosis not present

## 2024-05-23 DIAGNOSIS — F32A Depression, unspecified: Secondary | ICD-10-CM | POA: Diagnosis not present

## 2024-05-23 DIAGNOSIS — Z91040 Latex allergy status: Secondary | ICD-10-CM | POA: Diagnosis not present

## 2024-05-23 DIAGNOSIS — J45909 Unspecified asthma, uncomplicated: Secondary | ICD-10-CM | POA: Diagnosis not present

## 2024-05-23 DIAGNOSIS — D6851 Activated protein C resistance: Secondary | ICD-10-CM | POA: Diagnosis not present

## 2024-05-23 DIAGNOSIS — G809 Cerebral palsy, unspecified: Secondary | ICD-10-CM | POA: Diagnosis not present

## 2024-05-23 DIAGNOSIS — K7689 Other specified diseases of liver: Secondary | ICD-10-CM | POA: Diagnosis not present

## 2024-05-23 DIAGNOSIS — Z91011 Allergy to milk products, unspecified: Secondary | ICD-10-CM | POA: Diagnosis not present

## 2024-05-23 LAB — CBC WITH DIFFERENTIAL (CANCER CENTER ONLY)
Abs Immature Granulocytes: 0.04 K/uL (ref 0.00–0.07)
Basophils Absolute: 0.1 K/uL (ref 0.0–0.1)
Basophils Relative: 1 %
Eosinophils Absolute: 0.3 K/uL (ref 0.0–0.5)
Eosinophils Relative: 2 %
HCT: 39 % (ref 36.0–46.0)
Hemoglobin: 12.9 g/dL (ref 12.0–15.0)
Immature Granulocytes: 0 %
Lymphocytes Relative: 25 %
Lymphs Abs: 3.4 K/uL (ref 0.7–4.0)
MCH: 29.2 pg (ref 26.0–34.0)
MCHC: 33.1 g/dL (ref 30.0–36.0)
MCV: 88.2 fL (ref 80.0–100.0)
Monocytes Absolute: 1.1 K/uL — ABNORMAL HIGH (ref 0.1–1.0)
Monocytes Relative: 8 %
Neutro Abs: 8.6 K/uL — ABNORMAL HIGH (ref 1.7–7.7)
Neutrophils Relative %: 64 %
Platelet Count: 535 K/uL — ABNORMAL HIGH (ref 150–400)
RBC: 4.42 MIL/uL (ref 3.87–5.11)
RDW: 14 % (ref 11.5–15.5)
WBC Count: 13.5 K/uL — ABNORMAL HIGH (ref 4.0–10.5)
nRBC: 0 % (ref 0.0–0.2)

## 2024-05-23 LAB — D-DIMER, QUANTITATIVE: D-Dimer, Quant: <0.27 ug{FEU}/mL (ref 0.00–0.50)

## 2024-05-23 NOTE — Progress Notes (Signed)
 [facility]  HEMATOLOGY CLINIC PROGRESS NOTE  PATIENT NAME: Leslie Holt   MR#: [id number] DOB: Sep 18, 1980  Patient Care Team: Barbra Odor, NP as PCP - General (Nurse Practitioner)  Date of visit: 05/23/2024   ASSESSMENT & PLAN:   Leslie Holt is a 43 y.o. lady with a past medical history of attention deficit disorder, anxiety, depression, asthma, GERD, was referred to our service for evaluation after she was diagnosed with portal vein thrombosis in August 2025.    Hx of Portal vein thrombosis Portal vein thrombosis confirmed via CT scan in August 2025, with clot involving two branches of the right portal vein.   Suspected etiology is estrogen-containing oral contraceptives, though further investigation is needed to rule out other causes. Currently on Eliquis  5 mg twice daily, which has resolved her pain.  On her consultation with us  on 04/18/2024, we pursued hypercoagulable workup.  Factor V Leiden mutation, prothrombin gene mutation were negative.  Beta-2  glycoprotein antibodies and anticoagulant antibodies were negative.  D-dimer was undetectable. Rest of the thrombophilia workup was deferred as it can be falsely abnormal given recent thrombosis and current use of Eliquis .   Following completion of 3 months of anticoagulation, repeat CT abdomen and pelvis on 05/17/2024 showed resolution of portal vein thrombosis.  Patient opted to come off of anticoagulation.  Will continue close surveillance.   She was advised to avoid estrogen containing birth control pills and switch to progestin only medication.   RTC in 3 months for follow-up with repeat labs.  We will pursue remaining hypercoagulable workup on return visit.  Will also discuss using aspirin 81 mg for clot prevention.   Hepatic steatosis CT scan shows hepatic steatosis with fat depression in the liver. No acute intervention required at this time.  I spent a total of 22 minutes during this encounter with  the patient including review of chart and various tests results, discussions about plan of care and coordination of care plan.  I reviewed lab results and outside records for this visit and discussed relevant results with the patient. Diagnosis, plan of care and treatment options were also discussed in detail with the patient. Opportunity provided to ask questions and answers provided to her apparent satisfaction. Provided instructions to call our clinic with any problems, questions or concerns prior to return visit. I recommended to continue follow-up with PCP and sub-specialists. She verbalized understanding and agreed with the plan. No barriers to learning was detected.  [name], MD  05/23/2024 5:32 PM  [facility] - A DEPT OF [facility] [address] [place] [address] Dept: [phone] Dept Fax: [phone]   CHIEF COMPLAINT/ REASON FOR VISIT:  Portal vein thrombosis diagnosed in August 2025.  Resolved on follow-up scan in November 2025 after 3 months of anticoagulation.  INTERVAL HISTORY:  Discussed the use of AI scribe software for clinical note transcription with the patient, who gave verbal consent to proceed.  [name] is a 43 year old female who presents for follow-up after a recent CT scan.  She underwent a CT scan last week to evaluate a previously identified blood clot. The results show no evidence of the clot. However, the scan revealed some fat deposition in the liver.  She has undergone extensive workup for clotting disorders, including tests for beta 2 glycoprotein antibodies, anti-cancer antibodies, prothrombin gene mutation, and factor VI mutation, all of which were negative. Additional tests are  pending for future evaluation.  Her D-dimer levels were undetectable. She has been on blood thinners.  No new symptoms, including no nausea or vomiting, and  denies any pain or unusual symptoms.    SUMMARY OF HEMATOLOGIC HISTORY:  She presented for hematology consultation following a hospitalization for portal vein thrombosis.   In August 2025, she was hospitalized for portal vein thrombosis after experiencing abdominal pain, nausea, and diarrhea for three to four days. Initially attributing these symptoms to food poisoning, she sought medical attention when she persisted. Imaging studies during her hospital stay included an ultrasound, which the patient recalls as identifying the blood clots, and a CT scan, for which an addendum was later made to the report. She was discharged on Eliquis  5 mg twice daily, and her symptoms improved approximately 24 hours after admission with no recurrence of pain since then.   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 This emergency surgery resulted in the premature birth of her son, who has cerebral palsy due to anoxic injury. She has a history of high platelet counts, with recent values ranging from 423,000 to 527,000, and elevated white blood cell counts, with recent values between 14,000 and 18,000.   Family history is notable for her mother having a stroke at age 69, with no other family history of blood clotting disorders. She has been on estrogen-containing birth control pills, which are suspected to have contributed to her clotting issue. She took her last dose today and is considering switching to a progesterone-only formulation.   She does not smoke and has abstained from alcohol since starting blood thinners. She has a history of allergies for which she uses an inhaler and is currently receiving allergy shots. No current chest pain or breathing difficulties. She was in the [employer] but left after her son was born to care for him due to his disability.   Portal vein thrombosis confirmed via CT scan in  August 2025, with clot involving two branches of the right portal vein.   Suspected etiology is estrogen-containing oral contraceptives, though further investigation is needed to rule out other causes. Currently on Eliquis  5 mg twice daily, which has resolved her pain.   On her consultation with us  on 04/18/2024, we pursued hypercoagulable workup.  Factor V Leiden mutation, prothrombin gene mutation were negative.  Beta-2  glycoprotein antibodies and anticoagulant antibodies were negative.  D-dimer was undetectable. Rest of the thrombophilia workup was deferred as it can be falsely abnormal given recent thrombosis and current use of Eliquis .   Following completion of 3 months of anticoagulation, repeat CT abdomen and pelvis on 05/17/2024 showed resolution of portal vein thrombosis.  Patient opted to come off of anticoagulation.  Will continue close surveillance.   She was advised to avoid estrogen containing birth control pills and switch to progestin only medication.   I have reviewed the past medical history, past surgical history, social history and family history with the patient and they are unchanged from previous note.  ALLERGIES: She is allergic to shellfish allergy, sulfa antibiotics, goat milk, and latex.  MEDICATIONS:  Current Outpatient Medications  Medication Sig Dispense Refill   APIXABAN  (ELIQUIS ) VTE STARTER PACK (10MG  AND 5MG ) Take 10 mg by mouth 2 (two) times daily for 7 days, THEN 5 mg 2 (two) times daily. 74 tablet 0   buPROPion  (WELLBUTRIN  XL) 300 MG 24 hr tablet Take 300 mg by mouth daily.  cetirizine (ZYRTEC) 10 MG tablet Take 10 mg by mouth daily.     escitalopram  (LEXAPRO ) 10 MG tablet Take 10 mg by mouth daily.     PROAIR RESPICLICK 108 (90 Base) MCG/ACT AEPB Inhale 2 puffs into the lungs 3 (three) times daily as needed (cough).     SLYND 4 MG TABS Take 1 tablet by mouth daily.     Triamcinolone Acetonide (NASACORT ALLERGY 24HR NA) Place 1 spray into the nose 2  (two) times daily.     No current facility-administered medications for this visit.     REVIEW OF SYSTEMS:    Review of Systems - Oncology  All other pertinent systems were reviewed with the patient and are negative.  PHYSICAL EXAMINATION:    Onc Performance Status - 05/23/24 1524       ECOG Perf Status   ECOG Perf Status Fully active, able to carry on all pre-disease performance without restriction      KPS SCALE   KPS % SCORE Normal, no compliants, no evidence of disease          Vitals:   05/23/24 1503  BP: 119/69  Pulse: 76  Resp: 18  Temp: 98.6 F (37 C)  SpO2: 96%   Filed Weights   05/23/24 1503  Weight: 234 lb 4.8 oz (106.3 kg)    Physical Exam Constitutional:      General: She is not in acute distress.    Appearance: Normal appearance.  HENT:     Head: Normocephalic and atraumatic.  Cardiovascular:     Rate and Rhythm: Normal rate.  Pulmonary:     Effort: Pulmonary effort is normal. No respiratory distress.  Abdominal:     General: There is no distension.  Neurological:     General: No focal deficit present.     Mental Status: She is alert and oriented to person, place, and time.  Psychiatric:        Mood and Affect: Mood normal.        Behavior: Behavior normal.     LABORATORY DATA:   I have reviewed the data as listed.  Results for orders placed or performed in visit on 05/23/24  D-dimer, quantitative  Result Value Ref Range   D-Dimer, Quant <0.27 0.00 - 0.50 ug/mL-FEU  CBC with Differential (Cancer Center Only)  Result Value Ref Range   WBC Count 13.5 (H) 4.0 - 10.5 K/uL   RBC 4.42 3.87 - 5.11 MIL/uL   Hemoglobin 12.9 12.0 - 15.0 g/dL   HCT 60.9 63.9 - 53.9 %   MCV 88.2 80.0 - 100.0 fL   MCH 29.2 26.0 - 34.0 pg   MCHC 33.1 30.0 - 36.0 g/dL   RDW 85.9 88.4 - 84.4 %   Platelet Count 535 (H) 150 - 400 K/uL   nRBC 0.0 0.0 - 0.2 %   Neutrophils Relative % 64 %   Neutro Abs 8.6 (H) 1.7 - 7.7 K/uL   Lymphocytes Relative 25 %    Lymphs Abs 3.4 0.7 - 4.0 K/uL   Monocytes Relative 8 %   Monocytes Absolute 1.1 (H) 0.1 - 1.0 K/uL   Eosinophils Relative 2 %   Eosinophils Absolute 0.3 0.0 - 0.5 K/uL   Basophils Relative 1 %   Basophils Absolute 0.1 0.0 - 0.1 K/uL   Immature Granulocytes 0 %   Abs Immature Granulocytes 0.04 0.00 - 0.07 K/uL     RADIOGRAPHIC STUDIES:  I have personally reviewed the radiological images as listed and  agree with the findings in the report.  CT ABDOMEN PELVIS W CONTRAST EXAM: CT ABDOMEN AND PELVIS WITH CONTRAST 05/17/2024 04:18:53 PM  TECHNIQUE: CT of the abdomen and pelvis was performed with the administration of 100 mL of iohexol  (OMNIPAQUE ) 300 MG/ML solution. Multiplanar reformatted images are provided for review. Automated exposure control, iterative reconstruction, and/or weight-based adjustment of the mA/kV was utilized to reduce the radiation dose to as low as reasonably achievable.  COMPARISON: Ultrasound and CT 02/15/2024.  CLINICAL HISTORY: Patient with portal vein thrombosis, on anticoagulation. Needs follow-up.  FINDINGS:  LOWER CHEST: No acute abnormality.  LIVER: Heterogeneous liver enhancement, perhaps due to heterogeneous steatosis. Resolution of previously noted thrombus within right portal vein branches.  GALLBLADDER AND BILE DUCTS: Gallbladder is unremarkable. No biliary ductal dilatation.  SPLEEN: Splenectomy.  PANCREAS: No acute abnormality.  ADRENAL GLANDS: No acute abnormality.  KIDNEYS, URETERS AND BLADDER: No stones in the kidneys or ureters. No hydronephrosis. No perinephric or periureteral stranding. Urinary bladder is unremarkable.  GI AND BOWEL: Stomach demonstrates no acute abnormality. There is no bowel obstruction.  PERITONEUM AND RETROPERITONEUM: No ascites. No free air.  VASCULATURE: Aorta is normal in caliber.  LYMPH NODES: No lymphadenopathy.  REPRODUCTIVE ORGANS: No acute abnormality.  BONES AND SOFT  TISSUES: No acute osseous abnormality. No focal soft tissue abnormality.  IMPRESSION: 1. Resolution of previously noted thrombus within right portal vein branches. 2. Heterogeneous liver enhancement, possibly related to heterogeneous steatosis. 3. Status post splenectomy.  Electronically signed by: Luke Bun MD 05/23/2024 01:40 AM EST RP Workstation: [id number]    Orders Placed This Encounter  Procedures   CBC with Differential (Cancer Center Only)    Standing Status:   Future    Expected Date:   08/23/2024    Expiration Date:   11/21/2024   D-dimer, quantitative    Standing Status:   Future    Expected Date:   08/23/2024    Expiration Date:   11/21/2024   Lupus anticoagulant panel    Standing Status:   Future    Expected Date:   08/23/2024    Expiration Date:   11/21/2024   Protein C activity    Standing Status:   Future    Expected Date:   08/23/2024    Expiration Date:   11/21/2024   PROTEIN S PANEL    Standing Status:   Future    Expected Date:   08/23/2024    Expiration Date:   11/21/2024   Antithrombin panel    Standing Status:   Future    Expected Date:   08/23/2024    Expiration Date:   11/21/2024     Future Appointments  Date Time Provider Department Center  08/15/2024  2:30 PM [facility] PHLEBOTOMIST [facility] None  08/15/2024  3:00 PM Jathen Sudano, Chinita, MD [facility] None    This document was completed utilizing speech recognition software. Grammatical errors, random word insertions, pronoun errors, and incomplete sentences are an occasional consequence of this system due to software limitations, ambient noise, and hardware issues. Any formal questions or concerns about the content, text or information contained within the body of this dictation should be directly addressed to the provider for clarification.

## 2024-05-23 NOTE — Telephone Encounter (Signed)
 error

## 2024-05-23 NOTE — Assessment & Plan Note (Signed)
 Portal vein thrombosis confirmed via CT scan in August 2025, with clot involving two branches of the right portal vein.   Suspected etiology is estrogen-containing oral contraceptives, though further investigation is needed to rule out other causes. Currently on Eliquis  5 mg twice daily, which has resolved her pain.  On her consultation with us  on 04/18/2024, we pursued hypercoagulable workup.  Factor V Leiden mutation, prothrombin gene mutation were negative.  Beta-2  glycoprotein antibodies and anticoagulant antibodies were negative.  D-dimer was undetectable. Rest of the thrombophilia workup was deferred as it can be falsely abnormal given recent thrombosis and current use of Eliquis .   Following completion of 3 months of anticoagulation, repeat CT abdomen and pelvis on 05/17/2024 showed resolution of portal vein thrombosis.  Patient opted to come off of anticoagulation.  Will continue close surveillance.   She was advised to avoid estrogen containing birth control pills and switch to progestin only medication.   RTC in 3 months for follow-up with repeat labs.  We will pursue remaining hypercoagulable workup on return visit.  Will also discuss using aspirin 81 mg for clot prevention.

## 2024-08-15 ENCOUNTER — Inpatient Hospital Stay

## 2024-08-15 ENCOUNTER — Inpatient Hospital Stay: Admitting: Oncology
